# Patient Record
Sex: Female | Born: 1995 | Race: White | Hispanic: No | Marital: Single | Smoking: Former smoker
Health system: Southern US, Community
[De-identification: ages and names within clinical notes are randomized; demographics above are authoritative.]

## PROBLEM LIST (undated history)

## (undated) ENCOUNTER — Inpatient Hospital Stay (HOSPITAL_COMMUNITY): Payer: Self-pay

## (undated) DIAGNOSIS — M549 Dorsalgia, unspecified: Secondary | ICD-10-CM

## (undated) DIAGNOSIS — T8859XA Other complications of anesthesia, initial encounter: Secondary | ICD-10-CM

## (undated) DIAGNOSIS — T4145XA Adverse effect of unspecified anesthetic, initial encounter: Secondary | ICD-10-CM

## (undated) DIAGNOSIS — J45909 Unspecified asthma, uncomplicated: Secondary | ICD-10-CM

## (undated) HISTORY — DX: Other complications of anesthesia, initial encounter: T88.59XA

## (undated) HISTORY — PX: ANKLE SURGERY: SHX546

## (undated) HISTORY — PX: WISDOM TOOTH EXTRACTION: SHX21

## (undated) HISTORY — DX: Adverse effect of unspecified anesthetic, initial encounter: T41.45XA

---

## 2017-05-28 ENCOUNTER — Emergency Department (HOSPITAL_COMMUNITY): Payer: Self-pay

## 2017-05-28 ENCOUNTER — Encounter (HOSPITAL_COMMUNITY): Payer: Self-pay | Admitting: Emergency Medicine

## 2017-05-28 ENCOUNTER — Emergency Department (HOSPITAL_COMMUNITY)
Admission: EM | Admit: 2017-05-28 | Discharge: 2017-05-28 | Disposition: A | Discharge status: Home / Self Care | Payer: Self-pay | Attending: Emergency Medicine | Admitting: Emergency Medicine

## 2017-05-28 DIAGNOSIS — G8929 Other chronic pain: Secondary | ICD-10-CM | POA: Insufficient documentation

## 2017-05-28 DIAGNOSIS — M545 Low back pain, unspecified: Secondary | ICD-10-CM

## 2017-05-28 HISTORY — DX: Dorsalgia, unspecified: M54.9

## 2017-05-28 LAB — URINALYSIS, ROUTINE W REFLEX MICROSCOPIC
Bilirubin Urine: NEGATIVE
GLUCOSE, UA: NEGATIVE mg/dL
Hgb urine dipstick: NEGATIVE
Ketones, ur: NEGATIVE mg/dL
Leukocytes, UA: NEGATIVE
Nitrite: NEGATIVE
Protein, ur: NEGATIVE mg/dL
SPECIFIC GRAVITY, URINE: 1.025 (ref 1.005–1.030)
pH: 6 (ref 5.0–8.0)

## 2017-05-28 LAB — PREGNANCY, URINE: PREG TEST UR: NEGATIVE

## 2017-05-28 MED ORDER — MELOXICAM 15 MG PO TABS: 15.0000 mg | ORAL_TABLET | Freq: Every day | ORAL | 0 refills | Status: DC

## 2017-05-28 MED ORDER — KETOROLAC TROMETHAMINE 60 MG/2ML IM SOLN
60.0000 mg | Freq: Once | INTRAMUSCULAR | Status: AC
  Administered 2017-05-28: 60 mg via INTRAMUSCULAR
  Filled 2017-05-28: qty 2

## 2017-05-28 MED ORDER — DEXAMETHASONE SODIUM PHOSPHATE 10 MG/ML IJ SOLN
10.0000 mg | Freq: Once | INTRAMUSCULAR | Status: AC
  Administered 2017-05-28: 10 mg via INTRAMUSCULAR
  Filled 2017-05-28: qty 1

## 2017-05-28 MED ORDER — ACETAMINOPHEN 325 MG PO TABS
650.0000 mg | ORAL_TABLET | Freq: Once | ORAL | Status: AC
  Administered 2017-05-28: 650 mg via ORAL
  Filled 2017-05-28: qty 2

## 2017-05-28 MED ORDER — IBUPROFEN 800 MG PO TABS
800.0000 mg | ORAL_TABLET | Freq: Once | ORAL | Status: AC
  Administered 2017-05-28: 800 mg via ORAL
  Filled 2017-05-28: qty 1

## 2017-05-28 NOTE — Discharge Instructions (Signed)
Your xray and work up were negative. You will need to follow up wit a primary care doctor.SEEK IMMEDIATE MEDICAL ATTENTION IF: New numbness, tingling, weakness, or problem with the use of your arms or legs.  Severe back pain not relieved with medications.  Change in bowel or bladder control.  Increasing pain in any areas of the body (such as chest or abdominal pain).  Shortness of breath, dizziness or fainting.  Nausea (feeling sick to your stomach), vomiting, fever, or sweats.

## 2017-05-28 NOTE — ED Provider Notes (Signed)
[facility]-EMERGENCY DEPT Provider Note    By signing my name below, I, Earmon Phoenix, attest that this documentation has been prepared under the direction and in the presence of Arthor Captain, PA-C. Electronically Signed: Earmon Phoenix, ED Scribe. 05/28/17. 1:27 PM.    History   Chief Complaint Chief Complaint  Patient presents with  . Back Pain   The history is provided by the patient and medical records. No language interpreter was used.    Laura Estrada is an obese 21 y.o. female with PMHx of chronic back pain who presents to the Emergency Department complaining of worsening low back pain that began two days ago. She reports associated pain that radiates down the posterior LLE to the knee. She reports dysuria for the past two weeks. She states she has seen a chiropractor in the past with some relief but has not seen one for this exacerbation. She reports first injuring her back about one year ago by lifting a heavy box. She has taken Ibuprofen for pain with no significant relief. Movements increase the pain.There are no alleviating factors noted. She denies bowel or bladder incontinence, saddle anesthesia, nausea, vomiting, fever, chills. Pt is sexually active and is not on any birth control.   Past Medical History:  Diagnosis Date  . Back pain     There are no active problems to display for this patient.   No past surgical history on file.  OB History    No data available       Home Medications    Prior to Admission medications   Not on File    Family History No family history on file.  Social History Social History  Substance Use Topics  . Smoking status: Not on file  . Smokeless tobacco: Not on file  . Alcohol use Not on file     Allergies   Patient has no allergy information on record.   Review of Systems Review of Systems  Constitutional: Negative for chills and fever.  Gastrointestinal: Negative for nausea and vomiting.       No bowel or  bladder incontinence  Genitourinary: Positive for dysuria.  Musculoskeletal: Positive for back pain.  Neurological: Negative for weakness and numbness.     Physical Exam Updated Vital Signs BP (!) 131/91 (BP Location: Left Arm)   Pulse 99   Temp 98 F (36.7 C) (Oral)   Resp 18   Ht 5\' 7"  (1.702 m)   Wt 210 lb (95.3 kg)   LMP 05/08/2017 (Exact Date)   SpO2 95%   BMI 32.89 kg/m   Physical Exam  Constitutional: She is oriented to person, place, and time. She appears well-developed and well-nourished.  HENT:  Head: Normocephalic and atraumatic.  Neck: Normal range of motion.  Cardiovascular: Normal rate.   Pulmonary/Chest: Effort normal.  Musculoskeletal: Normal range of motion.  Neurological: She is alert and oriented to person, place, and time.  Skin: Skin is warm and dry.  Psychiatric: She has a normal mood and affect. Her behavior is normal.  Nursing note and vitals reviewed.    ED Treatments / Results  DIAGNOSTIC STUDIES: Oxygen Saturation is 95% on RA, normal by my interpretation.   COORDINATION OF CARE: 11:50 AM- Will order urinalysis and pain reliever. Pt verbalizes understanding and agrees to plan.  1:30 PM- Will X-Ray lumbar spine. Urinalysis negative.   Medications  dexamethasone (DECADRON) injection 10 mg (not administered)  ketorolac (TORADOL) injection 60 mg (not administered)  ibuprofen (ADVIL,MOTRIN) tablet 800 mg (800  mg Oral Given 05/28/17 1233)  acetaminophen (TYLENOL) tablet 650 mg (650 mg Oral Given 05/28/17 1233)    Labs (all labs ordered are listed, but only abnormal results are displayed) Labs Reviewed  URINALYSIS, ROUTINE W REFLEX MICROSCOPIC  PREGNANCY, URINE    EKG  EKG Interpretation None       Radiology No results found.  Procedures Procedures (including critical care time)  Medications Ordered in ED Medications  dexamethasone (DECADRON) injection 10 mg (not administered)  ketorolac (TORADOL) injection 60 mg (not  administered)  ibuprofen (ADVIL,MOTRIN) tablet 800 mg (800 mg Oral Given 05/28/17 1233)  acetaminophen (TYLENOL) tablet 650 mg (650 mg Oral Given 05/28/17 1233)     Initial Impression / Assessment and Plan / ED Course  I have reviewed the triage vital signs and the nursing notes.  Pertinent labs & imaging results that were available during my care of the patient were reviewed by me and considered in my medical decision making (see chart for details).     Patient with exacerbation of chronic low back pain. No neurological deficits and normal neuro exam.  Patient is ambulatory. No loss of bowel or bladder control. No concern for cauda equina. No fever, night sweats, weight loss, h/o cancer, IVDA, no recent procedure to back. Urinalysis obtained due to dysuria for the past two week is negative. X-Ray negative for obvious fracture or dislocation. Supportive care and return precaution discussed. Appears safe for discharge at this time. Follow up as indicated in discharge paperwork.    Final Clinical Impressions(s) / ED Diagnoses   Final diagnoses:  Chronic low back pain without sciatica, unspecified back pain laterality    New Prescriptions New Prescriptions   No medications on file    I personally performed the services described in this documentation, which was scribed in my presence. The recorded information has been reviewed and is accurate.        Arthor CaptainHarris, Fernande Treiber, PA-C 05/28/17 1634    Cathren LaineSteinl, Kevin, MD 06/01/17 [phone]

## 2017-05-28 NOTE — ED Notes (Signed)
Patient transported to X-ray 

## 2017-05-28 NOTE — ED Triage Notes (Signed)
Pt reports that she chronic back pain from injury lifting last year. Has seen chiropractors on and off and sometimes that helps. Lately been taking 800mg  ibuprofen and not helping. States [place] doc in [place] that gave her injection and helped for 6mos. Neuro intact. Ambulates [facility]

## 2017-12-17 ENCOUNTER — Encounter (HOSPITAL_COMMUNITY): Payer: Self-pay

## 2017-12-17 ENCOUNTER — Other Ambulatory Visit: Payer: Self-pay

## 2017-12-17 ENCOUNTER — Emergency Department (HOSPITAL_COMMUNITY)
Admission: EM | Admit: 2017-12-17 | Discharge: 2017-12-17 | Disposition: A | Discharge status: Home / Self Care | Payer: Medicaid Other | Attending: Emergency Medicine | Admitting: Emergency Medicine

## 2017-12-17 ENCOUNTER — Emergency Department (HOSPITAL_COMMUNITY): Payer: Medicaid Other

## 2017-12-17 DIAGNOSIS — R102 Pelvic and perineal pain: Secondary | ICD-10-CM | POA: Diagnosis not present

## 2017-12-17 DIAGNOSIS — N760 Acute vaginitis: Secondary | ICD-10-CM

## 2017-12-17 DIAGNOSIS — R109 Unspecified abdominal pain: Secondary | ICD-10-CM

## 2017-12-17 DIAGNOSIS — Z3A00 Weeks of gestation of pregnancy not specified: Secondary | ICD-10-CM | POA: Insufficient documentation

## 2017-12-17 DIAGNOSIS — O23599 Infection of other part of genital tract in pregnancy, unspecified trimester: Secondary | ICD-10-CM | POA: Insufficient documentation

## 2017-12-17 DIAGNOSIS — B9689 Other specified bacterial agents as the cause of diseases classified elsewhere: Secondary | ICD-10-CM

## 2017-12-17 DIAGNOSIS — O26899 Other specified pregnancy related conditions, unspecified trimester: Secondary | ICD-10-CM

## 2017-12-17 DIAGNOSIS — O9989 Other specified diseases and conditions complicating pregnancy, childbirth and the puerperium: Secondary | ICD-10-CM | POA: Insufficient documentation

## 2017-12-17 LAB — WET PREP, GENITAL
Sperm: NONE SEEN
Trich, Wet Prep: NONE SEEN
Yeast Wet Prep HPF POC: NONE SEEN

## 2017-12-17 LAB — BASIC METABOLIC PANEL
Anion gap: 10 (ref 5–15)
BUN: 10 mg/dL (ref 6–20)
CALCIUM: 9.7 mg/dL (ref 8.9–10.3)
CO2: 19 mmol/L — ABNORMAL LOW (ref 22–32)
CREATININE: 0.81 mg/dL (ref 0.44–1.00)
Chloride: 107 mmol/L (ref 101–111)
GFR calc non Af Amer: >60 mL/min (ref >60)
Glucose, Bld: 114 mg/dL — ABNORMAL HIGH (ref 65–99)
Potassium: 3.8 mmol/L (ref 3.5–5.1)
SODIUM: 136 mmol/L (ref 135–145)

## 2017-12-17 LAB — CBC
HCT: 39.1 % (ref 36.0–46.0)
Hemoglobin: 12.7 g/dL (ref 12.0–15.0)
MCH: 25.8 pg — ABNORMAL LOW (ref 26.0–34.0)
MCHC: 32.5 g/dL (ref 30.0–36.0)
MCV: 79.3 fL (ref 78.0–100.0)
PLATELETS: 340 10*3/uL (ref 150–400)
RBC: 4.93 MIL/uL (ref 3.87–5.11)
RDW: 14.9 % (ref 11.5–15.5)
WBC: 12.5 10*3/uL — AB (ref 4.0–10.5)

## 2017-12-17 LAB — URINALYSIS, ROUTINE W REFLEX MICROSCOPIC
Bilirubin Urine: NEGATIVE
Glucose, UA: NEGATIVE mg/dL
Hgb urine dipstick: NEGATIVE
KETONES UR: 5 mg/dL — AB
LEUKOCYTES UA: NEGATIVE
NITRITE: NEGATIVE
PROTEIN: NEGATIVE mg/dL
Specific Gravity, Urine: 1.024 (ref 1.005–1.030)
pH: 5 (ref 5.0–8.0)

## 2017-12-17 LAB — I-STAT BETA HCG BLOOD, ED (MC, WL, AP ONLY): HCG, QUANTITATIVE: 182.9 m[IU]/mL — AB (ref <5)

## 2017-12-17 MED ORDER — METRONIDAZOLE 0.75 % VA GEL: 1.0000 | Freq: Every day | VAGINAL | 0 refills | Status: AC

## 2017-12-17 MED ORDER — PRENATAL COMPLETE 14-0.4 MG PO TABS: 1.0000 | ORAL_TABLET | Freq: Every day | ORAL | 0 refills | Status: DC

## 2017-12-17 NOTE — ED Notes (Signed)
Phlebotomy at bedside.

## 2017-12-17 NOTE — ED Notes (Signed)
Patient transported to Ultrasound 

## 2017-12-17 NOTE — ED Triage Notes (Signed)
Per Pt, Pt is coming from home with left lower pelvic pain and back pain that started yesterday. Pt denies any bleeding at this time, but reports having a positive pregnancy test. Hx of miscarriages.

## 2017-12-17 NOTE — ED Notes (Signed)
Per main lab, urine specimen never received. EDP aware. This RN to collect another sample. Pt updated to delay

## 2017-12-17 NOTE — ED Provider Notes (Signed)
[facility] EMERGENCY DEPARTMENT Provider Note   CSN: [id number] Arrival date & time: 12/17/17  0820     History   Chief Complaint Chief Complaint  Patient presents with  . Abdominal Cramping    HPI Laura Estrada is a 22 y.o. G2P0 female who is previously healthy who presents with a 2-3-day history of intermittent left lower pelvic cramping.  Patient reports she took a pregnancy test 2 days ago which was positive.  Her LMP finished on 11/11/2017.  She reports a white vaginal discharge for the past few days.  She has low concern for STD exposure.  She denies any abnormal vaginal bleeding.  She is not taking any medications at home for symptoms.  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  She denies any fevers.  She has had some associated nausea, vomiting, and one episode of nonbloody diarrhea.  She denies any chest pain or shortness of breath.  HPI  Past Medical History:  Diagnosis Date  . Back pain     There are no active problems to display for this patient.   Past Surgical History:  Procedure Laterality Date  . ANKLE SURGERY      OB History    Gravida Para Term Preterm AB Living   1             SAB TAB Ectopic Multiple Live Births                   Home Medications    Prior to Admission medications   Medication Sig Start Date End Date Taking? Authorizing Provider  meloxicam (MOBIC) 15 MG tablet Take 1 tablet (15 mg total) by mouth daily. Take 1 daily with food. Patient not taking: Reported on 12/17/2017 05/28/17   Arthor Captain, PA-C  metroNIDAZOLE (METROGEL VAGINAL) 0.75 % vaginal gel Place 1 Applicatorful vaginally at bedtime for 5 days. 12/17/17 12/22/17  Emi Holes, PA-C  Prenatal Vit-Fe Fumarate-FA (PRENATAL COMPLETE) 14-0.4 MG TABS Take 1 tablet by mouth daily. 12/17/17   Emi Holes, PA-C    Family History No family history on file.  Social History Social History   Tobacco Use  . Smoking  status: Never Smoker  . Smokeless tobacco: Never Used  Substance Use Topics  . Alcohol use: No    Frequency: Never  . Drug use: Yes    Types: Marijuana     Allergies   Penicillins   Review of Systems Review of Systems  Constitutional: Negative for chills and fever.  HENT: Negative for facial swelling and sore throat.   Respiratory: Negative for shortness of breath.   Cardiovascular: Negative for chest pain.  Gastrointestinal: Positive for diarrhea, nausea and vomiting. Negative for abdominal pain.  Genitourinary: Positive for pelvic pain and vaginal discharge. Negative for dysuria and vaginal bleeding.  Musculoskeletal: Negative for back pain.  Skin: Negative for rash and wound.  Neurological: Negative for headaches.  Psychiatric/Behavioral: The patient is not nervous/anxious.      Physical Exam Updated Vital Signs BP 130/72   Pulse 95   Temp 98.1 F (36.7 C) (Oral)   Resp 14   Ht 5\' 7"  (1.702 m)   Wt 95.3 kg (210 lb)   LMP 11/11/2017   SpO2 98%   BMI 32.89 kg/m   Physical Exam  Constitutional: She appears well-developed and well-nourished. No distress.  HENT:  Head: Normocephalic and atraumatic.  Mouth/Throat: Oropharynx is clear and moist. No oropharyngeal exudate.  Eyes:  Conjunctivae are normal. Pupils are equal, round, and reactive to light. Right eye exhibits no discharge. Left eye exhibits no discharge. No scleral icterus.  Neck: Normal range of motion. Neck supple. No thyromegaly present.  Cardiovascular: Normal rate, regular rhythm, normal heart sounds and intact distal pulses. Exam reveals no gallop and no friction rub.  No murmur heard. Pulmonary/Chest: Effort normal and breath sounds normal. No stridor. No respiratory distress. She has no wheezes. She has no rales.  Abdominal: Soft. Bowel sounds are normal. She exhibits no distension. There is no tenderness. There is no rebound and no guarding.  Genitourinary: Uterus normal. Cervix exhibits no motion  tenderness. Right adnexum displays no tenderness. Left adnexum displays no tenderness. Vaginal discharge (white) found.  Musculoskeletal: She exhibits no edema.  Lymphadenopathy:    She has no cervical adenopathy.  Neurological: She is alert. Coordination normal.  Skin: Skin is warm and dry. No rash noted. She is not diaphoretic. No pallor.  Psychiatric: She has a normal mood and affect.  Nursing note and vitals reviewed.    ED Treatments / Results  Labs (all labs ordered are listed, but only abnormal results are displayed) Labs Reviewed  WET PREP, GENITAL - Abnormal; Notable for the following components:      Result Value   Clue Cells Wet Prep HPF POC PRESENT (*)    WBC, Wet Prep HPF POC MANY (*)    All other components within normal limits  URINALYSIS, ROUTINE W REFLEX MICROSCOPIC - Abnormal; Notable for the following components:   Ketones, ur 5 (*)    All other components within normal limits  BASIC METABOLIC PANEL - Abnormal; Notable for the following components:   CO2 19 (*)    Glucose, Bld 114 (*)    All other components within normal limits  CBC - Abnormal; Notable for the following components:   WBC 12.5 (*)    MCH 25.8 (*)    All other components within normal limits  I-STAT BETA HCG BLOOD, ED ([facility], [facility], [facility] ONLY) - Abnormal; Notable for the following components:   I-stat hCG, quantitative 182.9 (*)    All other components within normal limits  RPR  HIV ANTIBODY (ROUTINE TESTING)  GC/CHLAMYDIA PROBE AMP ([place]) NOT AT [facility]    EKG  EKG Interpretation None       Radiology US Ob Comp < 14 Wks  Result Date: 12/17/2017 CLINICAL DATA:  Left pelvic pain for 5 days. Positive pregnancy test. EXAM: OBSTETRIC <14 WK [place] AND TRANSVAGINAL OB US TECHNIQUE: Both transabdominal and transvaginal ultrasound examinations were performed for complete evaluation of the gestation as well as the maternal uterus, adnexal regions, and pelvic cul-de-sac. Transvaginal technique was  performed to assess early pregnancy. COMPARISON:  None. FINDINGS: Intrauterine gestational sac: None visualized. Subchorionic hemorrhage:  None visualized. Maternal uterus/adnexae: 17 mm complex, thick walled cystic focus identified in the parenchyma of the left ovary with no architecture identified within the small central cystic component. Right ovary unremarkable. No evidence for adnexal mass. Very trace simple appearing free fluid in the cul-de-sac. IMPRESSION: 1. No intrauterine gestational sac. Probable left ovarian corpus luteum cyst with no adnexal mass identified. Given the history of a positive pregnancy test, differential considerations include intrauterine gestation too early to visualize, completed abortion, or nonvisualized ectopic pregnancy. Electronically Signed   By: Kennith Center M.D.   On: 12/17/2017 13:22   US Ob Transvaginal  Result Date: 12/17/2017 CLINICAL DATA:  Left pelvic pain for 5 days. Positive pregnancy  test. EXAM: OBSTETRIC <14 WK US AND TRANSVAGINAL OB US TECHNIQUE: Both transabdominal and transvaginal ultrasound examinations were performed for complete evaluation of the gestation as well as the maternal uterus, adnexal regions, and pelvic cul-de-sac. Transvaginal technique was performed to assess early pregnancy. COMPARISON:  None. FINDINGS: Intrauterine gestational sac: None visualized. Subchorionic hemorrhage:  None visualized. Maternal uterus/adnexae: 17 mm complex, thick walled cystic focus identified in the parenchyma of the left ovary with no architecture identified within the small central cystic component. Right ovary unremarkable. No evidence for adnexal mass. Very trace simple appearing free fluid in the cul-de-sac. IMPRESSION: 1. No intrauterine gestational sac. Probable left ovarian corpus luteum cyst with no adnexal mass identified. Given the history of a positive pregnancy test, differential considerations include intrauterine gestation too early to visualize,  completed abortion, or nonvisualized ectopic pregnancy. Electronically Signed   By: Kennith CenterEric  Mansell M.D.   On: 12/17/2017 13:22    Procedures Procedures (including critical care time)  Medications Ordered in ED Medications - No data to display   Initial Impression / Assessment and Plan / ED Course  I have reviewed the triage vital signs and the nursing notes.  Pertinent labs & imaging results that were available during my care of the patient were reviewed by me and considered in my medical decision making (see chart for details).     Patient with left pelvic cramping over the past few days.  Patient had a positive pregnancy test 2 days ago.  HCG today is 182.9.  Pelvic ultrasound shows left ovarian cyst, 17 mm, without adnexal mass, but no intrauterine gestational sac.  Labs unremarkable except for mild elevation of white blood cell count, 12.5.  GC/chlamydia, HIV, RPR pending.  Will treat BV found on wet prep with MetroGel.  [facility] in 2 days for serial hCGs and further evaluation and treatment.  Initiated prenatal vitamins.  Return precautions discussed.  Patient understands and agrees with plan.  Patient vitals stable throughout ED course and discharged in satisfactory condition.  Final Clinical Impressions(s) / ED Diagnoses   Final diagnoses:  Abdominal pain in pregnancy, antepartum  BV (bacterial vaginosis)    ED Discharge Orders        Ordered    Prenatal Vit-Fe Fumarate-FA (PRENATAL COMPLETE) 14-0.4 MG TABS  Daily     12/17/17 1411    metroNIDAZOLE (METROGEL VAGINAL) 0.75 % vaginal gel  Daily at bedtime     12/17/17 [id number]       Azure Budnick M, PA-C 12/17/17 1645    Tegeler, Canary Brimhristopher J, MD 12/17/17 [phone]

## 2017-12-17 NOTE — Discharge Instructions (Signed)
Use MetroGel as prescribed for 5 days for bacterial vaginosis.  Begin taking prenatal vitamins.  Please follow-up at the women's outpatient clinic in 48 hours for repeat hormone levels.  [facility] or return to the emergency department immediately if you develop any new or worsening symptoms.

## 2017-12-18 LAB — GC/CHLAMYDIA PROBE AMP (~~LOC~~) NOT AT ARMC
Chlamydia: POSITIVE — AB
Neisseria Gonorrhea: NEGATIVE

## 2017-12-18 LAB — RPR: RPR Ser Ql: NONREACTIVE

## 2017-12-19 LAB — HIV ANTIBODY (ROUTINE TESTING W REFLEX): HIV Screen 4th Generation wRfx: NONREACTIVE

## 2017-12-22 ENCOUNTER — Ambulatory Visit (INDEPENDENT_AMBULATORY_CARE_PROVIDER_SITE_OTHER): Payer: Self-pay | Admitting: General Practice

## 2017-12-22 ENCOUNTER — Encounter: Payer: Self-pay | Admitting: Family Medicine

## 2017-12-22 DIAGNOSIS — B9689 Other specified bacterial agents as the cause of diseases classified elsewhere: Secondary | ICD-10-CM

## 2017-12-22 DIAGNOSIS — N760 Acute vaginitis: Secondary | ICD-10-CM

## 2017-12-22 DIAGNOSIS — O3680X0 Pregnancy with inconclusive fetal viability, not applicable or unspecified: Secondary | ICD-10-CM

## 2017-12-22 DIAGNOSIS — Z3490 Encounter for supervision of normal pregnancy, unspecified, unspecified trimester: Secondary | ICD-10-CM

## 2017-12-22 LAB — HCG, QUANTITATIVE, PREGNANCY: HCG, BETA CHAIN, QUANT, S: 2510 m[IU]/mL — AB (ref <5)

## 2017-12-22 MED ORDER — METRONIDAZOLE 500 MG PO TABS: 500.0000 mg | ORAL_TABLET | Freq: Two times a day (BID) | ORAL | 0 refills | Status: DC

## 2017-12-22 NOTE — Progress Notes (Signed)
Patient here for stat bhcg following ER visit on 1/10. Patient reports improved pain, still has some pain on left side. Discussed with patient waiting in lobby for results & updated plan of care. Patient verbalized understanding and has no questions at this time  Reviewed results with Dr Erin FullingHarraway Smith who recommends follow up ultrasound in 2 weeks and new OB appt. Scheduled u/s 1/29 @ 8am.  Informed patient of results & u/s appt. Ectopic precautions given. Patient verbalized understanding to all & had no questions   Attestation of Attending Supervision of RN: Evaluation and management procedures were performed by the nurse under my supervision and collaboration.  I have reviewed the nursing note and chart, and I agree with the management and plan.  Carolyn L. Harraway-Smith, M.D., Evern CoreFACOG

## 2017-12-24 ENCOUNTER — Encounter: Payer: Self-pay | Admitting: Obstetrics & Gynecology

## 2017-12-24 DIAGNOSIS — A749 Chlamydial infection, unspecified: Secondary | ICD-10-CM | POA: Insufficient documentation

## 2017-12-24 DIAGNOSIS — O98819 Other maternal infectious and parasitic diseases complicating pregnancy, unspecified trimester: Secondary | ICD-10-CM

## 2018-01-01 ENCOUNTER — Inpatient Hospital Stay (HOSPITAL_COMMUNITY)
Admission: AD | Admit: 2018-01-01 | Discharge: 2018-01-01 | Disposition: A | Discharge status: Home / Self Care | Payer: Medicaid Other | Source: Ambulatory Visit | Attending: Obstetrics and Gynecology | Admitting: Obstetrics and Gynecology

## 2018-01-01 ENCOUNTER — Other Ambulatory Visit: Payer: Self-pay

## 2018-01-01 ENCOUNTER — Encounter (HOSPITAL_COMMUNITY): Payer: Self-pay

## 2018-01-01 DIAGNOSIS — O99611 Diseases of the digestive system complicating pregnancy, first trimester: Secondary | ICD-10-CM | POA: Insufficient documentation

## 2018-01-01 DIAGNOSIS — A084 Viral intestinal infection, unspecified: Secondary | ICD-10-CM | POA: Insufficient documentation

## 2018-01-01 DIAGNOSIS — Z3A01 Less than 8 weeks gestation of pregnancy: Secondary | ICD-10-CM | POA: Insufficient documentation

## 2018-01-01 DIAGNOSIS — J069 Acute upper respiratory infection, unspecified: Secondary | ICD-10-CM | POA: Diagnosis not present

## 2018-01-01 DIAGNOSIS — O99511 Diseases of the respiratory system complicating pregnancy, first trimester: Secondary | ICD-10-CM | POA: Diagnosis not present

## 2018-01-01 DIAGNOSIS — R0981 Nasal congestion: Secondary | ICD-10-CM | POA: Diagnosis present

## 2018-01-01 DIAGNOSIS — O219 Vomiting of pregnancy, unspecified: Secondary | ICD-10-CM

## 2018-01-01 DIAGNOSIS — Z880 Allergy status to penicillin: Secondary | ICD-10-CM | POA: Insufficient documentation

## 2018-01-01 LAB — INFLUENZA PANEL BY PCR (TYPE A & B)
INFLAPCR: NEGATIVE
INFLBPCR: NEGATIVE

## 2018-01-01 LAB — RAPID STREP SCREEN (MED CTR MEBANE ONLY): STREPTOCOCCUS, GROUP A SCREEN (DIRECT): NEGATIVE

## 2018-01-01 LAB — URINALYSIS, ROUTINE W REFLEX MICROSCOPIC
BILIRUBIN URINE: NEGATIVE
Bacteria, UA: NONE SEEN
Glucose, UA: NEGATIVE mg/dL
Hgb urine dipstick: NEGATIVE
Ketones, ur: NEGATIVE mg/dL
Nitrite: NEGATIVE
PH: 7 (ref 5.0–8.0)
Protein, ur: NEGATIVE mg/dL
SPECIFIC GRAVITY, URINE: 1.019 (ref 1.005–1.030)

## 2018-01-01 LAB — COMPREHENSIVE METABOLIC PANEL
ALK PHOS: 60 U/L (ref 38–126)
ALT: 23 U/L (ref 14–54)
AST: 23 U/L (ref 15–41)
Albumin: 3.9 g/dL (ref 3.5–5.0)
Anion gap: 12 (ref 5–15)
BILIRUBIN TOTAL: 0.7 mg/dL (ref 0.3–1.2)
BUN: 13 mg/dL (ref 6–20)
CO2: 19 mmol/L — ABNORMAL LOW (ref 22–32)
CREATININE: 0.61 mg/dL (ref 0.44–1.00)
Calcium: 9.5 mg/dL (ref 8.9–10.3)
Chloride: 100 mmol/L — ABNORMAL LOW (ref 101–111)
Glucose, Bld: 100 mg/dL — ABNORMAL HIGH (ref 65–99)
Potassium: 4 mmol/L (ref 3.5–5.1)
Sodium: 131 mmol/L — ABNORMAL LOW (ref 135–145)
TOTAL PROTEIN: 7.7 g/dL (ref 6.5–8.1)

## 2018-01-01 LAB — CBC
HEMATOCRIT: 37.8 % (ref 36.0–46.0)
HEMOGLOBIN: 12.7 g/dL (ref 12.0–15.0)
MCH: 26.5 pg (ref 26.0–34.0)
MCHC: 33.6 g/dL (ref 30.0–36.0)
MCV: 78.8 fL (ref 78.0–100.0)
PLATELETS: 321 10*3/uL (ref 150–400)
RBC: 4.8 MIL/uL (ref 3.87–5.11)
RDW: 15.8 % — ABNORMAL HIGH (ref 11.5–15.5)
WBC: 13.7 10*3/uL — ABNORMAL HIGH (ref 4.0–10.5)

## 2018-01-01 MED ORDER — LACTATED RINGERS IV BOLUS (SEPSIS)
1000.0000 mL | Freq: Once | INTRAVENOUS | Status: AC
  Administered 2018-01-01: 1000 mL via INTRAVENOUS

## 2018-01-01 MED ORDER — PROMETHAZINE HCL 25 MG/ML IJ SOLN
25.0000 mg | Freq: Once | INTRAMUSCULAR | Status: AC
  Administered 2018-01-01: 25 mg via INTRAVENOUS
  Filled 2018-01-01: qty 1

## 2018-01-01 MED ORDER — PROMETHAZINE HCL 25 MG PO TABS: 12.5000 mg | ORAL_TABLET | Freq: Four times a day (QID) | ORAL | 2 refills | Status: AC | PRN

## 2018-01-01 NOTE — MAU Note (Signed)
Can't keep anything down. meds aren't working.  Has cough and congestion, throat doesn't really hurt,  Has not checked temperature.  Co-workers have been sick

## 2018-01-01 NOTE — MAU Provider Note (Signed)
Chief Complaint: Emesis; Cough; and Nasal Congestion   First Provider Initiated Contact with Patient 01/01/18 1213      SUBJECTIVE HPI: Laura Estrada is a 22 y.o. G1P0 at 3564w2d by LMP who presents to maternity admissions reporting congestion, cough, sore throat, n/v and diarrhea x 4 days. She reports the first symptoms were nasal congestion and sore throat, followed by cough, then n/v/d.  She reports vomiting 1-2 times daily and diarrhea x2 daily for 4 days. She has known exposure to an illness at work with co-workers having URI and GI symptoms.  She has not tried any treatments. Although she reports intermittent chills and feeling feverish, she has not taken her temperature at home.  There are no other associated symptoms. She had US on 1/10 that did not show IUP but had appropriate rise in hcg from 1/10 to 1/15 and has outpatient US ordered on 1/29.  She denies abdominal pain, vaginal bleeding, vaginal itching/burning, urinary symptoms, h/a, or dizziness.   HPI  Past Medical History:  Diagnosis Date  . Back pain    Past Surgical History:  Procedure Laterality Date  . ANKLE SURGERY     Social History   Socioeconomic History  . Marital status: Single    Spouse name: Not on file  . Number of children: Not on file  . Years of education: Not on file  . Highest education level: Not on file  Social Needs  . Financial resource strain: Not on file  . Food insecurity - worry: Not on file  . Food insecurity - inability: Not on file  . Transportation needs - medical: Not on file  . Transportation needs - non-medical: Not on file  Occupational History  . Not on file  Tobacco Use  . Smoking status: Never Smoker  . Smokeless tobacco: Never Used  Substance and Sexual Activity  . Alcohol use: No    Frequency: Never  . Drug use: Yes    Types: Marijuana  . Sexual activity: Not on file  Other Topics Concern  . Not on file  Social History Narrative  . Not on file   No current  facility-administered medications on file prior to encounter.    Current Outpatient Medications on File Prior to Encounter  Medication Sig Dispense Refill  . meloxicam (MOBIC) 15 MG tablet Take 1 tablet (15 mg total) by mouth daily. Take 1 daily with food. (Patient not taking: Reported on 12/17/2017) 10 tablet 0  . metroNIDAZOLE (FLAGYL) 500 MG tablet Take 1 tablet (500 mg total) by mouth 2 (two) times daily. 14 tablet 0  . Prenatal Vit-Fe Fumarate-FA (PRENATAL COMPLETE) 14-0.4 MG TABS Take 1 tablet by mouth daily. 60 each 0   Allergies  Allergen Reactions  . Penicillins Rash    Has patient had a PCN reaction causing immediate rash, facial/tongue/throat swelling, SOB or lightheadedness with hypotension: YES Has patient had a PCN reaction causing severe rash involving mucus membranes or skin necrosis: NO Has patient had a PCN reaction that required hospitalization: UNK Has patient had a PCN reaction occurring within the last 10 years: NO If all of the above answers are "NO", then may proceed with Cephalosporin use.    ROS:  Review of Systems  Constitutional: Positive for chills. Negative for fatigue and fever.  HENT: Positive for congestion, rhinorrhea and sore throat. Negative for sinus pressure.   Eyes: Negative for photophobia.  Respiratory: Negative for shortness of breath.   Cardiovascular: Negative for chest pain.  Gastrointestinal: Positive for  diarrhea, nausea and vomiting. Negative for abdominal pain and constipation.  Genitourinary: Negative for difficulty urinating, dysuria, flank pain, frequency, pelvic pain, vaginal bleeding, vaginal discharge and vaginal pain.  Musculoskeletal: Negative for neck pain.  Neurological: Negative for dizziness, weakness and headaches.  Psychiatric/Behavioral: Negative.      I have reviewed patient's Past Medical Hx, Surgical Hx, Family Hx, Social Hx, medications and allergies.   Physical Exam   Patient Vitals for the past 24 hrs:  BP Temp  Pulse Resp Weight  01/01/18 1145 120/69 97.7 F (36.5 C) 98 16 -  01/01/18 1125 - - - - 267 lb (121.1 kg)   Constitutional: Well-developed, well-nourished female in no acute distress.  Cardiovascular: normal rate Respiratory: normal effort GI: Abd soft, non-tender. Pos BS x 4 MS: Extremities nontender, no edema, normal ROM Neurologic: Alert and oriented x 4.  GU: Neg CVAT.  PELVIC EXAM: Deferred   LAB RESULTS Results for orders placed or performed during the hospital encounter of 01/01/18 (from the past 24 hour(s))  Urinalysis, Routine w reflex microscopic     Status: Abnormal   Collection Time: 01/01/18 11:20 AM  Result Value Ref Range   Color, Urine YELLOW YELLOW   APPearance HAZY (A) CLEAR   Specific Gravity, Urine 1.019 1.005 - 1.030   pH 7.0 5.0 - 8.0   Glucose, UA NEGATIVE NEGATIVE mg/dL   Hgb urine dipstick NEGATIVE NEGATIVE   Bilirubin Urine NEGATIVE NEGATIVE   Ketones, ur NEGATIVE NEGATIVE mg/dL   Protein, ur NEGATIVE NEGATIVE mg/dL   Nitrite NEGATIVE NEGATIVE   Leukocytes, UA TRACE (A) NEGATIVE   RBC / HPF 0-5 0 - 5 RBC/hpf   WBC, UA 0-5 0 - 5 WBC/hpf   Bacteria, UA NONE SEEN NONE SEEN   Squamous Epithelial / LPF 6-30 (A) NONE SEEN   Mucus PRESENT   CBC     Status: Abnormal   Collection Time: 01/01/18 11:56 AM  Result Value Ref Range   WBC 13.7 (H) 4.0 - 10.5 K/uL   RBC 4.80 3.87 - 5.11 MIL/uL   Hemoglobin 12.7 12.0 - 15.0 g/dL   HCT 40.9 81.1 - 91.4 %   MCV 78.8 78.0 - 100.0 fL   MCH 26.5 26.0 - 34.0 pg   MCHC 33.6 30.0 - 36.0 g/dL   RDW 78.2 (H) 95.6 - 21.3 %   Platelets 321 150 - 400 K/uL  Comprehensive metabolic panel     Status: Abnormal   Collection Time: 01/01/18 11:56 AM  Result Value Ref Range   Sodium 131 (L) 135 - 145 mmol/L   Potassium 4.0 3.5 - 5.1 mmol/L   Chloride 100 (L) 101 - 111 mmol/L   CO2 19 (L) 22 - 32 mmol/L   Glucose, Bld 100 (H) 65 - 99 mg/dL   BUN 13 6 - 20 mg/dL   Creatinine, Ser 0.86 0.44 - 1.00 mg/dL   Calcium 9.5 8.9 -  57.8 mg/dL   Total Protein 7.7 6.5 - 8.1 g/dL   Albumin 3.9 3.5 - 5.0 g/dL   AST 23 15 - 41 U/L   ALT 23 14 - 54 U/L   Alkaline Phosphatase 60 38 - 126 U/L   Total Bilirubin 0.7 0.3 - 1.2 mg/dL   GFR calc non Af Amer >60 >60 mL/min   GFR calc Af Amer >60 >60 mL/min   Anion gap 12 5 - 15  Influenza panel by PCR (type A & B)     Status: None   Collection Time:  01/01/18 12:45 PM  Result Value Ref Range   Influenza A By PCR NEGATIVE NEGATIVE   Influenza B By PCR NEGATIVE NEGATIVE  Rapid Strep Screen (Not at [facility])     Status: None   Collection Time: 01/01/18 12:45 PM  Result Value Ref Range   Streptococcus, Group A Screen (Direct) NEGATIVE NEGATIVE       IMAGING   MAU Management/MDM: Ordered labs and reviewed results.  WBCs wnl for pregnancy, electrolytes wnl.  Influenza negative, rapid strep test negative.  Pt taking Flagyl for BV so may have contributed to gastroenteritis.  Pt completed course.  Will treat n/v with Phenergan, pt with loose stools vs watery diarrhea so may take OTC imodium if worsens.  OTC meds for cold symptoms.  Safe meds in pregnancy list given.  Increase PO fluids. F/U as scheduled with outpatient [place] and prenatal care.  Pt discharged with strict precautions/reasons to return reviewed.  ASSESSMENT 1. Viral upper respiratory tract infection   2. Viral gastroenteritis   3. Nausea and vomiting during pregnancy prior to [redacted] weeks gestation     PLAN Discharge home Allergies as of 01/01/2018      Reactions   Penicillins Rash   Has patient had a PCN reaction causing immediate rash, facial/tongue/throat swelling, SOB or lightheadedness with hypotension: YES Has patient had a PCN reaction causing severe rash involving mucus membranes or skin necrosis: NO Has patient had a PCN reaction that required hospitalization: UNK Has patient had a PCN reaction occurring within the last 10 years: NO If all of the above answers are "NO", then may proceed with Cephalosporin use.       Medication List    STOP taking these medications   meloxicam 15 MG tablet Commonly known as:  MOBIC   metroNIDAZOLE 500 MG tablet Commonly known as:  FLAGYL     TAKE these medications   PRENATAL COMPLETE 14-0.4 MG Tabs Take 1 tablet by mouth daily.   promethazine 25 MG tablet Commonly known as:  PHENERGAN Take 0.5-1 tablets (12.5-25 mg total) by mouth every 6 (six) hours as needed.      Follow-up Information    [facility].   Specialty:  Obstetrics and Gynecology Why:  As scheduled, return to MAU as needed for emergencies Contact information: [address] [place] [address] [phone]          Sharen Counter Certified Nurse-Midwife 01/01/2018  2:23 PM

## 2018-01-03 LAB — CULTURE, GROUP A STREP (THRC)

## 2018-01-05 ENCOUNTER — Ambulatory Visit (HOSPITAL_COMMUNITY)
Admission: RE | Admit: 2018-01-05 | Discharge: 2018-01-05 | Disposition: A | Discharge status: Home / Self Care | Payer: Medicaid Other | Source: Ambulatory Visit | Attending: Obstetrics & Gynecology | Admitting: Obstetrics & Gynecology

## 2018-01-05 ENCOUNTER — Ambulatory Visit: Payer: Self-pay | Admitting: *Deleted

## 2018-01-05 DIAGNOSIS — Z3A01 Less than 8 weeks gestation of pregnancy: Secondary | ICD-10-CM | POA: Insufficient documentation

## 2018-01-05 DIAGNOSIS — O3680X0 Pregnancy with inconclusive fetal viability, not applicable or unspecified: Secondary | ICD-10-CM

## 2018-01-05 DIAGNOSIS — Z712 Person consulting for explanation of examination or test findings: Secondary | ICD-10-CM

## 2018-01-05 DIAGNOSIS — Z3491 Encounter for supervision of normal pregnancy, unspecified, first trimester: Secondary | ICD-10-CM | POA: Insufficient documentation

## 2018-01-05 NOTE — Progress Notes (Addendum)
US results reviewed by Dr. Erin FullingHarraway-Smith. Pt and FOB informed of results including a likely change in due date to 08/29/18.  Common complaints of early pregnancy discussed.  Pt has New Ob appt scheduled on 02/02/18. Pt voiced understanding of all information and instructions.    Attestation of Attending Supervision of RN: Evaluation and management procedures were performed by the nurse under my supervision and collaboration.  I have reviewed the nursing note and chart, and I agree with the management and plan.  Carolyn L. Harraway-Smith, M.D., Evern CoreFACOG

## 2018-01-07 ENCOUNTER — Inpatient Hospital Stay (HOSPITAL_COMMUNITY)
Admission: AD | Admit: 2018-01-07 | Discharge: 2018-01-08 | Disposition: A | Discharge status: Home / Self Care | Payer: Medicaid Other | Source: Ambulatory Visit | Attending: Obstetrics & Gynecology | Admitting: Obstetrics & Gynecology

## 2018-01-07 ENCOUNTER — Encounter (HOSPITAL_COMMUNITY): Payer: Self-pay | Admitting: *Deleted

## 2018-01-07 DIAGNOSIS — Z3A01 Less than 8 weeks gestation of pregnancy: Secondary | ICD-10-CM | POA: Insufficient documentation

## 2018-01-07 DIAGNOSIS — O219 Vomiting of pregnancy, unspecified: Secondary | ICD-10-CM

## 2018-01-07 DIAGNOSIS — O210 Mild hyperemesis gravidarum: Secondary | ICD-10-CM | POA: Insufficient documentation

## 2018-01-07 DIAGNOSIS — Z880 Allergy status to penicillin: Secondary | ICD-10-CM | POA: Insufficient documentation

## 2018-01-07 DIAGNOSIS — R112 Nausea with vomiting, unspecified: Secondary | ICD-10-CM

## 2018-01-07 LAB — URINALYSIS, ROUTINE W REFLEX MICROSCOPIC
BILIRUBIN URINE: NEGATIVE
Bacteria, UA: NONE SEEN
GLUCOSE, UA: NEGATIVE mg/dL
HGB URINE DIPSTICK: NEGATIVE
KETONES UR: 20 mg/dL — AB
LEUKOCYTES UA: NEGATIVE
Nitrite: NEGATIVE
PH: 7 (ref 5.0–8.0)
PROTEIN: 30 mg/dL — AB
Specific Gravity, Urine: 1.031 — ABNORMAL HIGH (ref 1.005–1.030)

## 2018-01-07 LAB — POCT PREGNANCY, URINE: Preg Test, Ur: POSITIVE — AB

## 2018-01-07 MED ORDER — PROMETHAZINE HCL 25 MG PO TABS
25.0000 mg | ORAL_TABLET | Freq: Once | ORAL | Status: AC
  Administered 2018-01-07: 25 mg via ORAL
  Filled 2018-01-07: qty 1

## 2018-01-07 MED ORDER — DEXTROSE IN LACTATED RINGERS 5 % IV SOLN
INTRAVENOUS | Status: DC
  Administered 2018-01-07: 23:00:00 via INTRAVENOUS

## 2018-01-07 MED ORDER — LACTATED RINGERS IV SOLN
INTRAVENOUS | Status: DC
  Administered 2018-01-08: via INTRAVENOUS

## 2018-01-07 MED ORDER — PROMETHAZINE HCL 25 MG/ML IJ SOLN
12.5000 mg | Freq: Once | INTRAMUSCULAR | Status: AC
  Administered 2018-01-07: 12.5 mg via INTRAVENOUS
  Filled 2018-01-07: qty 1

## 2018-01-07 NOTE — MAU Note (Signed)
PT SAYS SHE WAS HERE ON 1-25- RECEIVED MEDS FOR VOMITING - PHENERGAN- DID NOT  PICK UP RX.

## 2018-01-07 NOTE — MAU Provider Note (Signed)
Chief Complaint: No chief complaint on file.   First Provider Initiated Contact with Patient 01/07/18 2136        SUBJECTIVE HPI: Laura Estrada is a 22 y.o. G1P0 at 6648w4d by LMP who presents to maternity admissions reporting recurrent vomiting.  Never filled Rx for Phenergan due to cost.  [facility].  States unable to keep anything down. . She denies vaginal bleeding, vaginal itching/burning, urinary symptoms, h/a, dizziness, or fever/chills.    Emesis   This is a recurrent problem. The problem occurs 5 to 10 times per day. The problem has been unchanged. There has been no fever. Associated symptoms include dizziness. Pertinent negatives include no abdominal pain, chills, coughing, diarrhea or fever. She has tried nothing for the symptoms.    RN note: PT SAYS SHE WAS HERE ON 1-25- RECEIVED MEDS FOR VOMITING - PHENERGAN- DID NOT  PICK UP RX   Past Medical History:  Diagnosis Date  . Back pain    Past Surgical History:  Procedure Laterality Date  . ANKLE SURGERY     Social History   Socioeconomic History  . Marital status: Single    Spouse name: Not on file  . Number of children: Not on file  . Years of education: Not on file  . Highest education level: Not on file  Social Needs  . Financial resource strain: Not on file  . Food insecurity - worry: Not on file  . Food insecurity - inability: Not on file  . Transportation needs - medical: Not on file  . Transportation needs - non-medical: Not on file  Occupational History  . Not on file  Tobacco Use  . Smoking status: Never Smoker  . Smokeless tobacco: Never Used  Substance and Sexual Activity  . Alcohol use: No    Frequency: Never  . Drug use: Yes    Types: Marijuana    Comment: LAST TIME ON 12-30-2017  . Sexual activity: Not on file  Other Topics Concern  . Not on file  Social History Narrative  . Not on file   No current facility-administered medications on file prior to  encounter.    Current Outpatient Medications on File Prior to Encounter  Medication Sig Dispense Refill  . Prenatal Vit-Fe Fumarate-FA (PRENATAL COMPLETE) 14-0.4 MG TABS Take 1 tablet by mouth daily. 60 each 0  . promethazine (PHENERGAN) 25 MG tablet Take 0.5-1 tablets (12.5-25 mg total) by mouth every 6 (six) hours as needed. 30 tablet 2   Allergies  Allergen Reactions  . Penicillins Rash    Has patient had a PCN reaction causing immediate rash, facial/tongue/throat swelling, SOB or lightheadedness with hypotension: YES Has patient had a PCN reaction causing severe rash involving mucus membranes or skin necrosis: NO Has patient had a PCN reaction that required hospitalization: UNK Has patient had a PCN reaction occurring within the last 10 years: NO If all of the above answers are "NO", then may proceed with Cephalosporin use.    I have reviewed patient's Past Medical Hx, Surgical Hx, Family Hx, Social Hx, medications and allergies.   ROS:  Review of Systems  Constitutional: Negative for chills and fever.  Respiratory: Negative for cough.   Gastrointestinal: Positive for vomiting. Negative for abdominal pain and diarrhea.  Neurological: Positive for dizziness.   Review of Systems  Other systems negative   Physical Exam  Physical Exam Patient Vitals for the past 24 hrs:  Temp Height Weight  01/07/18  2134 98.2 F (36.8 C) 5\' 7"  (1.702 m) 269 lb 12 oz (122.4 kg)   Constitutional: Well-developed, well-nourished female in no acute distress.  Cardiovascular: normal rate Respiratory: normal effort GI: Abd soft, non-tender. Pos BS x 4 MS: Extremities nontender, no edema, normal ROM Neurologic: Alert and oriented x 4.  GU: Neg CVAT.  PELVIC EXAM: deferred   LAB RESULTS Results for orders placed or performed during the hospital encounter of 01/07/18 (from the past 24 hour(s))  Urinalysis, Routine w reflex microscopic     Status: Abnormal   Collection Time: 01/07/18  9:13 PM   Result Value Ref Range   Color, Urine YELLOW YELLOW   APPearance HAZY (A) CLEAR   Specific Gravity, Urine 1.031 (H) 1.005 - 1.030   pH 7.0 5.0 - 8.0   Glucose, UA NEGATIVE NEGATIVE mg/dL   Hgb urine dipstick NEGATIVE NEGATIVE   Bilirubin Urine NEGATIVE NEGATIVE   Ketones, ur 20 (A) NEGATIVE mg/dL   Protein, ur 30 (A) NEGATIVE mg/dL   Nitrite NEGATIVE NEGATIVE   Leukocytes, UA NEGATIVE NEGATIVE   RBC / HPF 0-5 0 - 5 RBC/hpf   WBC, UA 0-5 0 - 5 WBC/hpf   Bacteria, UA NONE SEEN NONE SEEN   Squamous Epithelial / LPF 0-5 (A) NONE SEEN   Mucus PRESENT   Pregnancy, urine POC     Status: Abnormal   Collection Time: 01/07/18  9:23 PM  Result Value Ref Range   Preg Test, Ur POSITIVE (A) NEGATIVE       IMAGING   MAU Management/MDM: IV started Administered 2 liters of fluid with Phenergan Patient was able to keep down fluids afterward and felt better Reviewed $4 list.at [facility]   ASSESSMENT Single IUP at [redacted]w[redacted]d .Non-intractable vomiting with nausea, unspecified vomiting type - Plan: Discharge patient, Discharge patient   PLAN Discharge home Has rx for Phenergan Advance diet as tolerated  Pt stable at time of discharge. Encouraged to return here or to other Urgent Care/ED if she develops worsening of symptoms, increase in pain, fever, or other concerning symptoms.    Wynelle Bourgeois CNM, MSN Certified Nurse-Midwife 01/07/2018  9:36 PM

## 2018-01-07 NOTE — MAU Note (Signed)
PO ICE 

## 2018-01-07 NOTE — MAU Note (Signed)
B-ROOM 

## 2018-01-08 MED ORDER — VITAMIN B-6 50 MG PO TABS: 50.0000 mg | ORAL_TABLET | Freq: Two times a day (BID) | ORAL | 1 refills | Status: AC

## 2018-01-08 NOTE — Discharge Instructions (Signed)
Morning Sickness °Morning sickness is when you feel sick to your stomach (nauseous) during pregnancy. This nauseous feeling may or may not come with vomiting. It often occurs in the morning but can be a problem any time of day. Morning sickness is most common during the first trimester, but it may continue throughout pregnancy. While morning sickness is unpleasant, it is usually harmless unless you develop severe and continual vomiting (hyperemesis gravidarum). This condition requires more intense treatment. °What are the causes? °The cause of morning sickness is not completely known but seems to be related to normal hormonal changes that occur in pregnancy. °What increases the risk? °You are at greater risk if you: °· Experienced nausea or vomiting before your pregnancy. °· Had morning sickness during a previous pregnancy. °· Are pregnant with more than one baby, such as twins. ° °How is this treated? °Do not use any medicines (prescription, over-the-counter, or herbal) for morning sickness without first talking to your health care provider. Your health care provider may prescribe or recommend: °· Vitamin B6 supplements. °· Anti-nausea medicines. °· The herbal medicine ginger. ° °Follow these instructions at home: °· Only take over-the-counter or prescription medicines as directed by your health care provider. °· Taking multivitamins before getting pregnant can prevent or decrease the severity of morning sickness in most women. °· Eat a piece of dry toast or unsalted crackers before getting out of bed in the morning. °· Eat five or six small meals a day. °· Eat dry and bland foods (rice, baked potato). Foods high in carbohydrates are often helpful. °· Do not drink liquids with your meals. Drink liquids between meals. °· Avoid greasy, fatty, and spicy foods. °· Get someone to cook for you if the smell of any food causes nausea and vomiting. °· If you feel nauseous after taking prenatal vitamins, take the vitamins at  night or with a snack. °· Snack on protein foods (nuts, yogurt, cheese) between meals if you are hungry. °· Eat unsweetened gelatins for desserts. °· Wearing an acupressure wristband (worn for sea sickness) may be helpful. °· Acupuncture may be helpful. °· Do not smoke. °· Get a humidifier to keep the air in your house free of odors. °· Get plenty of fresh air. °Contact a health care provider if: °· Your home remedies are not working, and you need medicine. °· You feel dizzy or lightheaded. °· You are losing weight. °Get help right away if: °· You have persistent and uncontrolled nausea and vomiting. °· You pass out (faint). °This information is not intended to replace advice given to you by your health care provider. Make sure you discuss any questions you have with your health care provider. °Document Released: 01/15/2007 Document Revised: 05/01/2016 Document Reviewed: 05/11/2013 °[employer] Interactive Patient Education © 2017 [employer] ° °

## 2018-02-02 ENCOUNTER — Encounter: Payer: Self-pay | Admitting: Obstetrics & Gynecology

## 2018-02-02 ENCOUNTER — Ambulatory Visit (INDEPENDENT_AMBULATORY_CARE_PROVIDER_SITE_OTHER): Payer: Self-pay | Admitting: Obstetrics & Gynecology

## 2018-02-02 ENCOUNTER — Other Ambulatory Visit: Payer: Self-pay | Admitting: Obstetrics & Gynecology

## 2018-02-02 ENCOUNTER — Other Ambulatory Visit: Payer: Self-pay

## 2018-02-02 VITALS — BP 137/77 | HR 126 | Wt 273.5 lb

## 2018-02-02 DIAGNOSIS — Z3400 Encounter for supervision of normal first pregnancy, unspecified trimester: Secondary | ICD-10-CM | POA: Insufficient documentation

## 2018-02-02 DIAGNOSIS — Z124 Encounter for screening for malignant neoplasm of cervix: Secondary | ICD-10-CM

## 2018-02-02 DIAGNOSIS — Z113 Encounter for screening for infections with a predominantly sexual mode of transmission: Secondary | ICD-10-CM | POA: Diagnosis not present

## 2018-02-02 DIAGNOSIS — Z3481 Encounter for supervision of other normal pregnancy, first trimester: Secondary | ICD-10-CM

## 2018-02-02 LAB — POCT URINALYSIS DIP (DEVICE)
BILIRUBIN URINE: NEGATIVE
GLUCOSE, UA: NEGATIVE mg/dL
Hgb urine dipstick: NEGATIVE
Ketones, ur: NEGATIVE mg/dL
LEUKOCYTES UA: NEGATIVE
Nitrite: NEGATIVE
Protein, ur: NEGATIVE mg/dL
SPECIFIC GRAVITY, URINE: 1.02 (ref 1.005–1.030)
UROBILINOGEN UA: 0.2 mg/dL (ref 0.0–1.0)
pH: 7 (ref 5.0–8.0)

## 2018-02-02 NOTE — Patient Instructions (Signed)
First Trimester of Pregnancy The first trimester of pregnancy is from week 1 until the end of week 13 (months 1 through 3). A week after a sperm fertilizes an egg, the egg will implant on the wall of the uterus. This embryo will begin to develop into a baby. Genes from you and your partner will form the baby. The female genes will determine whether the baby will be a boy or a girl. At 6-8 weeks, the eyes and face will be formed, and the heartbeat can be seen on ultrasound. At the end of 12 weeks, all the baby's organs will be formed. Now that you are pregnant, you will want to do everything you can to have a healthy baby. Two of the most important things are to get good prenatal care and to follow your health care provider's instructions. Prenatal care is all the medical care you receive before the baby's birth. This care will help prevent, find, and treat any problems during the pregnancy and childbirth. Body changes during your first trimester Your body goes through many changes during pregnancy. The changes vary from woman to woman.  You may gain or lose a couple of pounds at first.  You may feel sick to your stomach (nauseous) and you may throw up (vomit). If the vomiting is uncontrollable, call your health care provider.  You may tire easily.  You may develop headaches that can be relieved by medicines. All medicines should be approved by your health care provider.  You may urinate more often. Painful urination may mean you have a bladder infection.  You may develop heartburn as a result of your pregnancy.  You may develop constipation because certain hormones are causing the muscles that push stool through your intestines to slow down.  You may develop hemorrhoids or swollen veins (varicose veins).  Your breasts may begin to grow larger and become tender. Your nipples may stick out more, and the tissue that surrounds them (areola) may become darker.  Your gums may bleed and may be  sensitive to brushing and flossing.  Dark spots or blotches (chloasma, mask of pregnancy) may develop on your face. This will likely fade after the baby is born.  Your menstrual periods will stop.  You may have a loss of appetite.  You may develop cravings for certain kinds of food.  You may have changes in your emotions from day to day, such as being excited to be pregnant or being concerned that something may go wrong with the pregnancy and baby.  You may have more vivid and strange dreams.  You may have changes in your hair. These can include thickening of your hair, rapid growth, and changes in texture. Some women also have hair loss during or after pregnancy, or hair that feels dry or thin. Your hair will most likely return to normal after your baby is born.  What to expect at prenatal visits During a routine prenatal visit:  You will be weighed to make sure you and the baby are growing normally.  Your blood pressure will be taken.  Your abdomen will be measured to track your baby's growth.  The fetal heartbeat will be listened to between weeks 10 and 14 of your pregnancy.  Test results from any previous visits will be discussed.  Your health care provider may ask you:  How you are feeling.  If you are feeling the baby move.  If you have had any abnormal symptoms, such as leaking fluid, bleeding, severe headaches,   or abdominal cramping.  If you are using any tobacco products, including cigarettes, chewing tobacco, and electronic cigarettes.  If you have any questions.  Other tests that may be performed during your first trimester include:  Blood tests to find your blood type and to check for the presence of any previous infections. The tests will also be used to check for low iron levels (anemia) and protein on red blood cells (Rh antibodies). Depending on your risk factors, or if you previously had diabetes during pregnancy, you may have tests to check for high blood  sugar that affects pregnant women (gestational diabetes).  Urine tests to check for infections, diabetes, or protein in the urine.  An ultrasound to confirm the proper growth and development of the baby.  Fetal screens for spinal cord problems (spina bifida) and Down syndrome.  HIV (human immunodeficiency virus) testing. Routine prenatal testing includes screening for HIV, unless you choose not to have this test.  You may need other tests to make sure you and the baby are doing well.  Follow these instructions at home: Medicines  Follow your health care provider's instructions regarding medicine use. Specific medicines may be either safe or unsafe to take during pregnancy.  Take a prenatal vitamin that contains at least 600 micrograms (mcg) of folic acid.  If you develop constipation, try taking a stool softener if your health care provider approves. Eating and drinking  Eat a balanced diet that includes fresh fruits and vegetables, whole grains, good sources of protein such as meat, eggs, or tofu, and low-fat dairy. Your health care provider will help you determine the amount of weight gain that is right for you.  Avoid raw meat and uncooked cheese. These carry germs that can cause birth defects in the baby.  Eating four or five small meals rather than three large meals a day may help relieve nausea and vomiting. If you start to feel nauseous, eating a few soda crackers can be helpful. Drinking liquids between meals, instead of during meals, also seems to help ease nausea and vomiting.  Limit foods that are high in fat and processed sugars, such as fried and sweet foods.  To prevent constipation: ? Eat foods that are high in fiber, such as fresh fruits and vegetables, whole grains, and beans. ? Drink enough fluid to keep your urine clear or pale yellow. Activity  Exercise only as directed by your health care provider. Most women can continue their usual exercise routine during  pregnancy. Try to exercise for 30 minutes at least 5 days a week. Exercising will help you: ? Control your weight. ? Stay in shape. ? Be prepared for labor and delivery.  Experiencing pain or cramping in the lower abdomen or lower back is a good sign that you should stop exercising. Check with your health care provider before continuing with normal exercises.  Try to avoid standing for long periods of time. Move your legs often if you must stand in one place for a long time.  Avoid heavy lifting.  Wear low-heeled shoes and practice good posture.  You may continue to have sex unless your health care provider tells you not to. Relieving pain and discomfort  Wear a good support bra to relieve breast tenderness.  Take warm sitz baths to soothe any pain or discomfort caused by hemorrhoids. Use hemorrhoid cream if your health care provider approves.  Rest with your legs elevated if you have leg cramps or low back pain.  If you develop   varicose veins in your legs, wear support hose. Elevate your feet for 15 minutes, 3-4 times a day. Limit salt in your diet. Prenatal care  Schedule your prenatal visits by the twelfth week of pregnancy. They are usually scheduled monthly at first, then more often in the last 2 months before delivery.  Write down your questions. Take them to your prenatal visits.  Keep all your prenatal visits as told by your health care provider. This is important. Safety  Wear your seat belt at all times when driving.  Make a list of emergency phone numbers, including numbers for family, friends, the hospital, and police and fire departments. General instructions  Ask your health care provider for a referral to a local prenatal education class. Begin classes no later than the beginning of month 6 of your pregnancy.  Ask for help if you have counseling or nutritional needs during pregnancy. Your health care provider can offer advice or refer you to specialists for help  with various needs.  Do not use hot tubs, steam rooms, or saunas.  Do not douche or use tampons or scented sanitary pads.  Do not cross your legs for long periods of time.  Avoid cat litter boxes and soil used by cats. These carry germs that can cause birth defects in the baby and possibly loss of the fetus by miscarriage or stillbirth.  Avoid all smoking, herbs, alcohol, and medicines not prescribed by your health care provider. Chemicals in these products affect the formation and growth of the baby.  Do not use any products that contain nicotine or tobacco, such as cigarettes and e-cigarettes. If you need help quitting, ask your health care provider. You may receive counseling support and other resources to help you quit.  Schedule a dentist appointment. At home, brush your teeth with a soft toothbrush and be gentle when you floss. Contact a health care provider if:  You have dizziness.  You have mild pelvic cramps, pelvic pressure, or nagging pain in the abdominal area.  You have persistent nausea, vomiting, or diarrhea.  You have a bad smelling vaginal discharge.  You have pain when you urinate.  You notice increased swelling in your face, hands, legs, or ankles.  You are exposed to fifth disease or chickenpox.  You are exposed to German measles (rubella) and have never had it. Get help right away if:  You have a fever.  You are leaking fluid from your vagina.  You have spotting or bleeding from your vagina.  You have severe abdominal cramping or pain.  You have rapid weight gain or loss.  You vomit blood or material that looks like coffee grounds.  You develop a severe headache.  You have shortness of breath.  You have any kind of trauma, such as from a fall or a car accident. Summary  The first trimester of pregnancy is from week 1 until the end of week 13 (months 1 through 3).  Your body goes through many changes during pregnancy. The changes vary from  woman to woman.  You will have routine prenatal visits. During those visits, your health care provider will examine you, discuss any test results you may have, and talk with you about how you are feeling. This information is not intended to replace advice given to you by your health care provider. Make sure you discuss any questions you have with your health care provider. Document Released: 11/18/2001 Document Revised: 11/05/2016 Document Reviewed: 11/05/2016 Elsevier Interactive Patient Education  2018 Else[employer]   Inc[employer]  

## 2018-02-02 NOTE — Progress Notes (Signed)
  Subjective:needs TOC chlamydia    Laura Estrada is a G2P0010 580w2d being seen today for her first obstetrical visit.  Her obstetrical history is significant for obesity. Patient does intend to breast feed. Pregnancy history fully reviewed.  Patient reports vomiting.  Vitals:   02/02/18 0827  BP: 137/77  Pulse: (!) 126  Weight: 273 lb 8 oz (124.1 kg)    HISTORY: OB History  Gravida Para Term Preterm AB Living  2       1    SAB TAB Ectopic Multiple Live Births  1            # Outcome Date GA Lbr Len/2nd Weight Sex Delivery Anes PTL Lv  2 Current           1 SAB 12/02/14 3665w0d            Past Medical History:  Diagnosis Date  . Back pain   . Complication of anesthesia    woke up during wisdom teeth extraction   Past Surgical History:  Procedure Laterality Date  . ANKLE SURGERY    . WISDOM TOOTH EXTRACTION     Family History  Problem Relation Age of Onset  . Hypertension Maternal Grandmother   . Hernia Paternal Grandmother        passed away d/t sepsis  . Parkinson's disease Paternal Grandmother   . Cancer Paternal Grandfather      Exam    Uterus:   10 week  Pelvic Exam:    Perineum: No Hemorrhoids   Vulva: normal   Vagina:  normal mucosa   pH:     Cervix: no lesions   Adnexa: normal adnexa and no mass, fullness, tenderness   Bony Pelvis: average  System: Breast:  normal appearance, no masses or tenderness   Skin: normal coloration and turgor, no rashes    Neurologic: normal, normal mood   Extremities: normal strength, tone, and muscle mass   HEENT PERRLA, neck supple with midline trachea and thyroid without masses   Mouth/Teeth mucous membranes moist, pharynx normal without lesions and dental hygiene good   Neck supple and no masses   Cardiovascular: regular rate and rhythm, no murmurs or gallops   Respiratory:  appears well, vitals normal, no respiratory distress, acyanotic, normal RR, neck free of mass or lymphadenopathy, chest clear, no wheezing,  crepitations, rhonchi, normal symmetric air entry   Abdomen: soft, non-tender; bowel sounds normal; no masses,  no organomegaly   Urinary: urethral meatus normal      Assessment:    Pregnancy: G2P0010 Patient Active Problem List   Diagnosis Date Noted  . Supervision of low-risk first pregnancy 02/02/2018  . Chlamydia infection affecting pregnancy 12/24/2017        Plan:     Initial labs drawn. Prenatal vitamins. Problem list reviewed and updated. Genetic Screening discussedordered.Panorama Ultrasound discussed; fetal survey: 18 weeks.  Follow up in 4 weeks. 50% of 30 min visit spent on counseling and coordination of care.     Scheryl DarterJames Ailani Governale 02/02/2018

## 2018-02-02 NOTE — Progress Notes (Signed)
FHR obtained with bedside US via M-mode. 

## 2018-02-03 LAB — CYTOLOGY - PAP
CHLAMYDIA, DNA PROBE: NEGATIVE
Diagnosis: NEGATIVE
NEISSERIA GONORRHEA: NEGATIVE

## 2018-02-04 ENCOUNTER — Inpatient Hospital Stay (HOSPITAL_COMMUNITY)
Admission: AD | Admit: 2018-02-04 | Discharge: 2018-02-04 | Disposition: A | Discharge status: Home / Self Care | Payer: Medicaid Other | Source: Ambulatory Visit | Attending: Obstetrics and Gynecology | Admitting: Obstetrics and Gynecology

## 2018-02-04 ENCOUNTER — Encounter: Payer: Self-pay | Admitting: Obstetrics & Gynecology

## 2018-02-04 ENCOUNTER — Encounter (HOSPITAL_COMMUNITY): Payer: Self-pay

## 2018-02-04 ENCOUNTER — Other Ambulatory Visit: Payer: Self-pay

## 2018-02-04 DIAGNOSIS — O210 Mild hyperemesis gravidarum: Secondary | ICD-10-CM

## 2018-02-04 DIAGNOSIS — Z87891 Personal history of nicotine dependence: Secondary | ICD-10-CM | POA: Insufficient documentation

## 2018-02-04 DIAGNOSIS — Z880 Allergy status to penicillin: Secondary | ICD-10-CM | POA: Insufficient documentation

## 2018-02-04 DIAGNOSIS — Z3A10 10 weeks gestation of pregnancy: Secondary | ICD-10-CM | POA: Diagnosis not present

## 2018-02-04 LAB — URINALYSIS, ROUTINE W REFLEX MICROSCOPIC
Bilirubin Urine: NEGATIVE
GLUCOSE, UA: NEGATIVE mg/dL
HGB URINE DIPSTICK: NEGATIVE
Ketones, ur: NEGATIVE mg/dL
LEUKOCYTES UA: NEGATIVE
NITRITE: NEGATIVE
PROTEIN: 30 mg/dL — AB
SPECIFIC GRAVITY, URINE: 1.023 (ref 1.005–1.030)
pH: 8 (ref 5.0–8.0)

## 2018-02-04 NOTE — MAU Provider Note (Signed)
History     CSN: [id number]  Arrival date and time: 02/04/18 1138   First Provider Initiated Contact with Patient 02/04/18 1228      Chief Complaint  Patient presents with  . Emesis   HPI Laura Estrada 22 y.o.  Comes to MAU today with repetitive vomiting.  Has phenergan at home.  Does not have her Medicaid card yet.  Does not like phenergan as it makes her sleepy.  Is able to drink Sprite, juices, and eat yogurt.  Notices some traces of blood when vomiting but does not have full red vomitus.  Has soft stools but no watery diarrhea.     OB History    Gravida Para Term Preterm AB Living   2       1     SAB TAB Ectopic Multiple Live Births   1              Past Medical History:  Diagnosis Date  . Back pain   . Complication of anesthesia    woke up during wisdom teeth extraction    Past Surgical History:  Procedure Laterality Date  . ANKLE SURGERY    . WISDOM TOOTH EXTRACTION      Family History  Problem Relation Age of Onset  . Hypertension Maternal Grandmother   . Hernia Paternal Grandmother        passed away d/t sepsis  . Parkinson's disease Paternal Grandmother   . Cancer Paternal Grandfather     Social History   Tobacco Use  . Smoking status: Former Games developermoker  . Smokeless tobacco: Never Used  Substance Use Topics  . Alcohol use: No    Frequency: Never  . Drug use: Yes    Types: Marijuana    Comment: LAST TIME ON 12-30-2017    Allergies:  Allergies  Allergen Reactions  . Penicillins Rash    Has patient had a PCN reaction causing immediate rash, facial/tongue/throat swelling, SOB or lightheadedness with hypotension: YES Has patient had a PCN reaction causing severe rash involving mucus membranes or skin necrosis: NO Has patient had a PCN reaction that required hospitalization: UNK Has patient had a PCN reaction occurring within the last 10 years: NO If all of the above answers are "NO", then may proceed with Cephalosporin use.    Medications Prior  to Admission  Medication Sig Dispense Refill Last Dose  . Prenatal Vit-Fe Fumarate-FA (PRENATAL COMPLETE) 14-0.4 MG TABS Take 1 tablet by mouth daily. 60 each 0 Taking  . promethazine (PHENERGAN) 25 MG tablet Take 0.5-1 tablets (12.5-25 mg total) by mouth every 6 (six) hours as needed. 30 tablet 2 Taking  . pyridOXINE (VITAMIN B-6) 50 MG tablet Take 1 tablet (50 mg total) by mouth 2 (two) times daily. (Patient not taking: Reported on 02/02/2018) 60 tablet 1 Not Taking    Review of Systems  Constitutional: Negative for fever.  Gastrointestinal: Positive for abdominal pain, diarrhea, nausea and vomiting.  Genitourinary: Negative for dysuria, vaginal bleeding and vaginal discharge.   Physical Exam   Blood pressure 135/77, pulse 97, temperature 98.8 F (37.1 C), resp. rate 18, height 5\' 7"  (1.702 m), weight 273 lb (123.8 kg), last menstrual period 11/11/2017.  Physical Exam  Nursing note and vitals reviewed. Constitutional: She is oriented to person, place, and time. She appears well-developed and well-nourished.  HENT:  Head: Normocephalic.  Eyes: EOM are normal.  Neck: Neck supple.  Cardiovascular: Normal rate.  Respiratory: Effort normal.  Musculoskeletal: Normal range of motion.  Neurological: She is alert and oriented to person, place, and time.  Skin: Skin is warm and dry.  Psychiatric: She has a normal mood and affect.    MAU Course  Procedures Results for orders placed or performed during the hospital encounter of 02/04/18 (from the past 24 hour(s))  Urinalysis, Routine w reflex microscopic     Status: Abnormal   Collection Time: 02/04/18 11:50 AM  Result Value Ref Range   Color, Urine YELLOW YELLOW   APPearance CLOUDY (A) CLEAR   Specific Gravity, Urine 1.023 1.005 - 1.030   pH 8.0 5.0 - 8.0   Glucose, UA NEGATIVE NEGATIVE mg/dL   Hgb urine dipstick NEGATIVE NEGATIVE   Bilirubin Urine NEGATIVE NEGATIVE   Ketones, ur NEGATIVE NEGATIVE mg/dL   Protein, ur 30 (A)  NEGATIVE mg/dL   Nitrite NEGATIVE NEGATIVE   Leukocytes, UA NEGATIVE NEGATIVE   RBC / HPF 0-5 0 - 5 RBC/hpf   WBC, UA 0-5 0 - 5 WBC/hpf   Bacteria, UA RARE (A) NONE SEEN   Squamous Epithelial / LPF 0-5 (A) NONE SEEN   Mucus PRESENT     MDM No ketones in urine.  When talking with client, she is able to take in food periodically.  Able to take in fluids.  Reviewed weights from 01-01-18 to present.  Is not losing weight. Client had been worried as she remembers her weight as 288 but no evidence of that weight found in her chart.  Discussed expectations for now and client is relieved.  When she has an insurance card, she can try to switch to Diclegis if she is still having problems with vomiting.  Assessment and Plan  Morning Sickness  Plan Vitamin B6 50 mg tablets and Unisom (Plain) - take one of each at bedtime and again in the morning. Continue to take small amounts of fluids every hour. If vomiting, try Bananas, applesauce, rice and toast.  Do not eat fried foods, do not eat oils. Eat small amounts every 3 hours. When you get your insurance card, let the office know if you need Diclegis. Continue to use the promethazine as prescribed to help control the vomiting if you need more than the B6 and Unisom.   Laura Estrada 02/04/2018, 12:58 PM

## 2018-02-04 NOTE — Progress Notes (Signed)
G2P0 @ 10.[redacted] wksga. Here dt n/v/d that started since 6 wksgo. Today had little bleeding in the emesis so decided to come in. Pt states informed Dr. Debroah LoopArnold regards to n/v/d but states "he did not say anything about it".

## 2018-02-04 NOTE — Discharge Instructions (Signed)
Vitamin B6 50 mg tablets and Unisom (Plain) - take one of each at bedtime and again in the morning. Continue to take small amounts of fluids every hour. If vomiting, try Bananas, applesauce, rice and toast.  Do not eat fried foods, do not eat oils. Eat small amounts every 3 hours. When you get your insurance card, let the office know if you need Diclegis. Continue to use the promethazine as prescribed to help control the vomiting if you need more than the B6 and Unisom.

## 2018-02-04 NOTE — MAU Note (Signed)
Pt reports she has had vomiting off and on and noticed some blood in her emesis and is worried.  Takes promethazine for nausea and it helps some. Does not  Take during the day due to drowsiness.

## 2018-02-05 ENCOUNTER — Encounter: Payer: Self-pay | Admitting: *Deleted

## 2018-02-10 LAB — HEMOGLOBIN A1C
Est. average glucose Bld gHb Est-mCnc: 114 mg/dL
HEMOGLOBIN A1C: 5.6 % (ref 4.8–5.6)

## 2018-02-10 LAB — OBSTETRIC PANEL, INCLUDING HIV
Antibody Screen: NEGATIVE
BASOS ABS: 0 10*3/uL (ref 0.0–0.2)
Basos: 0 %
EOS (ABSOLUTE): 0.1 10*3/uL (ref 0.0–0.4)
EOS: 1 %
HEMOGLOBIN: 11.7 g/dL (ref 11.1–15.9)
HEP B S AG: NEGATIVE
HIV Screen 4th Generation wRfx: NONREACTIVE
Hematocrit: 35.2 % (ref 34.0–46.6)
IMMATURE GRANS (ABS): 0 10*3/uL (ref 0.0–0.1)
IMMATURE GRANULOCYTES: 0 %
LYMPHS: 20 %
Lymphocytes Absolute: 2.5 10*3/uL (ref 0.7–3.1)
MCH: 26.8 pg (ref 26.6–33.0)
MCHC: 33.2 g/dL (ref 31.5–35.7)
MCV: 81 fL (ref 79–97)
MONOCYTES: 4 %
Monocytes Absolute: 0.5 10*3/uL (ref 0.1–0.9)
NEUTROS PCT: 75 %
Neutrophils Absolute: 9.2 10*3/uL — ABNORMAL HIGH (ref 1.4–7.0)
PLATELETS: 327 10*3/uL (ref 150–379)
RBC: 4.36 x10E6/uL (ref 3.77–5.28)
RDW: 17.6 % — ABNORMAL HIGH (ref 12.3–15.4)
RPR: NONREACTIVE
RUBELLA: 3.65 {index} (ref >0.99)
Rh Factor: NEGATIVE
WBC: 12.3 10*3/uL — ABNORMAL HIGH (ref 3.4–10.8)

## 2018-02-10 LAB — HEMOGLOBINOPATHY EVALUATION
FERRITIN: 19 ng/mL (ref 15–150)
HGB A2 QUANT: 2.1 % (ref 1.8–3.2)
HGB SOLUBILITY: NEGATIVE
HGB VARIANT: 0 %
Hgb A: 97.9 % (ref 96.4–98.8)
Hgb C: 0 %
Hgb F Quant: 0 % (ref 0.0–2.0)
Hgb S: 0 %

## 2018-02-10 LAB — SMN1 COPY NUMBER ANALYSIS (SMA CARRIER SCREENING)

## 2018-02-10 LAB — CYSTIC FIBROSIS GENE TEST

## 2018-02-11 LAB — URINE CULTURE, OB REFLEX

## 2018-02-11 LAB — CULTURE, OB URINE

## 2018-02-12 ENCOUNTER — Encounter: Payer: Self-pay | Admitting: Obstetrics & Gynecology

## 2018-02-16 ENCOUNTER — Encounter: Payer: Self-pay | Admitting: *Deleted

## 2018-02-16 ENCOUNTER — Other Ambulatory Visit: Payer: Self-pay | Admitting: Obstetrics & Gynecology

## 2018-02-16 DIAGNOSIS — O99891 Other specified diseases and conditions complicating pregnancy: Secondary | ICD-10-CM

## 2018-02-16 DIAGNOSIS — O9989 Other specified diseases and conditions complicating pregnancy, childbirth and the puerperium: Principal | ICD-10-CM

## 2018-02-16 DIAGNOSIS — R8271 Bacteriuria: Secondary | ICD-10-CM

## 2018-02-16 MED ORDER — NITROFURANTOIN MONOHYD MACRO 100 MG PO CAPS: 100.0000 mg | ORAL_CAPSULE | Freq: Two times a day (BID) | ORAL | 1 refills | Status: DC

## 2018-03-02 ENCOUNTER — Ambulatory Visit (INDEPENDENT_AMBULATORY_CARE_PROVIDER_SITE_OTHER): Payer: Self-pay | Admitting: Medical

## 2018-03-02 ENCOUNTER — Encounter: Payer: Self-pay | Admitting: Medical

## 2018-03-02 ENCOUNTER — Encounter: Payer: Self-pay | Admitting: *Deleted

## 2018-03-02 VITALS — BP 110/55 | HR 88 | Wt 276.8 lb

## 2018-03-02 DIAGNOSIS — O09892 Supervision of other high risk pregnancies, second trimester: Secondary | ICD-10-CM

## 2018-03-02 DIAGNOSIS — A749 Chlamydial infection, unspecified: Secondary | ICD-10-CM

## 2018-03-02 DIAGNOSIS — O98811 Other maternal infectious and parasitic diseases complicating pregnancy, first trimester: Secondary | ICD-10-CM

## 2018-03-02 DIAGNOSIS — O26892 Other specified pregnancy related conditions, second trimester: Secondary | ICD-10-CM

## 2018-03-02 DIAGNOSIS — Z6791 Unspecified blood type, Rh negative: Secondary | ICD-10-CM

## 2018-03-02 DIAGNOSIS — Z3400 Encounter for supervision of normal first pregnancy, unspecified trimester: Secondary | ICD-10-CM

## 2018-03-02 NOTE — Progress Notes (Signed)
   PRENATAL VISIT NOTE  Subjective:  Laura Estrada is a 22 y.o. G2P0010 at 9629w2d being seen today for ongoing prenatal care.  She is currently monitored for the following issues for this low-risk pregnancy and has Chlamydia infection affecting pregnancy; Supervision of low-risk first pregnancy; and Rh negative state in antepartum period, second trimester on their problem list.  Patient reports no complaints.  Contractions: Not present. Vag. Bleeding: None.  Movement: Present. Denies leaking of fluid.   The following portions of the patient's history were reviewed and updated as appropriate: allergies, current medications, past family history, past medical history, past social history, past surgical history and problem list. Problem list updated.  Objective:   Vitals:   03/02/18 1136  BP: (!) 110/55  Pulse: 88  Weight: 276 lb 12.8 oz (125.6 kg)    Fetal Status: Fetal Heart Rate (bpm): 150   Movement: Present     General:  Alert, oriented and cooperative. Patient is in no acute distress.  Skin: Skin is warm and dry. No rash noted.   Cardiovascular: Normal heart rate noted  Respiratory: Normal respiratory effort, no problems with respiration noted  Abdomen: Soft, gravid, appropriate for gestational age.  Pain/Pressure: Absent     Pelvic: Cervical exam deferred        Extremities: Normal range of motion.  Edema: None  Mental Status:  Normal mood and affect. Normal behavior. Normal judgment and thought content.   Assessment and Plan:  Pregnancy: G2P0010 at [redacted]w[redacted]d  1. Encounter for supervision of low-risk first pregnancy, antepartum - Genetic Screening - invalid results with last attempt (likely too early) - repeated today   2. Chlamydia infection affecting pregnancy in first trimester - [facility] and negative TOC at last visit 02/02/18  3. Rh negative state in antepartum period, second trimester - Patient aware of these results and need for Rhogam at  28 weeks   Preterm labor/ second trimester warning symptoms and general obstetric precautions including but not limited to vaginal bleeding, contractions, leaking of fluid and fetal movement were reviewed in detail with the patient. Please refer to After Visit Summary for other counseling recommendations.  Return in about 1 month (around 03/30/2018) for LOB.   Vonzella NippleJulie Jasmyne Lodato, PA-C

## 2018-03-02 NOTE — Patient Instructions (Signed)
Second Trimester of Pregnancy The second trimester is from week 13 through week 28, month 4 through 6. This is often the time in pregnancy that you feel your best. Often times, morning sickness has lessened or quit. You may have more energy, and you may get hungry more often. Your unborn baby (fetus) is growing rapidly. At the end of the sixth month, he or she is about 9 inches long and weighs about 1 pounds. You will likely feel the baby move (quickening) between 18 and 20 weeks of pregnancy. Follow these instructions at home:  Avoid all smoking, herbs, and alcohol. Avoid drugs not approved by your doctor.  Do not use any tobacco products, including cigarettes, chewing tobacco, and electronic cigarettes. If you need help quitting, ask your doctor. You may get counseling or other support to help you quit.  Only take medicine as told by your doctor. Some medicines are safe and some are not during pregnancy.  Exercise only as told by your doctor. Stop exercising if you start having cramps.  Eat regular, healthy meals.  Wear a good support bra if your breasts are tender.  Do not use hot tubs, steam rooms, or saunas.  Wear your seat belt when driving.  Avoid raw meat, uncooked cheese, and liter boxes and soil used by cats.  Take your prenatal vitamins.  Take 1500-2000 milligrams of calcium daily starting at the 20th week of pregnancy until you deliver your baby.  Try taking medicine that helps you poop (stool softener) as needed, and if your doctor approves. Eat more fiber by eating fresh fruit, vegetables, and whole grains. Drink enough fluids to keep your pee (urine) clear or pale yellow.  Take warm water baths (sitz baths) to soothe pain or discomfort caused by hemorrhoids. Use hemorrhoid cream if your doctor approves.  If you have puffy, bulging veins (varicose veins), wear support hose. Raise (elevate) your feet for 15 minutes, 3-4 times a day. Limit salt in your diet.  Avoid heavy  lifting, wear low heals, and sit up straight.  Rest with your legs raised if you have leg cramps or low back pain.  Visit your dentist if you have not gone during your pregnancy. Use a soft toothbrush to brush your teeth. Be gentle when you floss.  You can have sex (intercourse) unless your doctor tells you not to.  Go to your doctor visits. Get help if:  You feel dizzy.  You have mild cramps or pressure in your lower belly (abdomen).  You have a nagging pain in your belly area.  You continue to feel sick to your stomach (nauseous), throw up (vomit), or have watery poop (diarrhea).  You have bad smelling fluid coming from your vagina.  You have pain with peeing (urination). Get help right away if:  You have a fever.  You are leaking fluid from your vagina.  You have spotting or bleeding from your vagina.  You have severe belly cramping or pain.  You lose or gain weight rapidly.  You have trouble catching your breath and have chest pain.  You notice sudden or extreme puffiness (swelling) of your face, hands, ankles, feet, or legs.  You have not felt the baby move in over an hour.  You have severe headaches that do not go away with medicine.  You have vision changes. This information is not intended to replace advice given to you by your health care provider. Make sure you discuss any questions you have with your health care   provider. Document Released: 02/18/2010 Document Revised: 05/01/2016 Document Reviewed: 01/25/2013 Elsevier Interactive Patient Education  2017 Elsevier Inc.  

## 2018-03-08 ENCOUNTER — Encounter: Payer: Self-pay | Admitting: Medical

## 2018-03-16 ENCOUNTER — Encounter: Payer: Self-pay | Admitting: *Deleted

## 2018-03-30 ENCOUNTER — Ambulatory Visit (INDEPENDENT_AMBULATORY_CARE_PROVIDER_SITE_OTHER): Payer: Self-pay | Admitting: Obstetrics and Gynecology

## 2018-03-30 VITALS — BP 122/69 | HR 94 | Wt 273.4 lb

## 2018-03-30 DIAGNOSIS — Z6791 Unspecified blood type, Rh negative: Secondary | ICD-10-CM

## 2018-03-30 DIAGNOSIS — O98812 Other maternal infectious and parasitic diseases complicating pregnancy, second trimester: Secondary | ICD-10-CM

## 2018-03-30 DIAGNOSIS — Z3402 Encounter for supervision of normal first pregnancy, second trimester: Secondary | ICD-10-CM

## 2018-03-30 DIAGNOSIS — A749 Chlamydial infection, unspecified: Secondary | ICD-10-CM

## 2018-03-30 DIAGNOSIS — O26892 Other specified pregnancy related conditions, second trimester: Secondary | ICD-10-CM

## 2018-03-30 NOTE — Progress Notes (Signed)
   PRENATAL VISIT NOTE  Subjective:  Laura Estrada is a 22 y.o. G2P0010 at 5327w2d being seen today for ongoing prenatal care.  She is currently monitored for the following issues for this low-risk pregnancy and has Chlamydia infection affecting pregnancy; Supervision of low-risk first pregnancy; and Rh negative state in antepartum period, second trimester on their problem list.  Patient reports no complaints.  Contractions: Not present. Vag. Bleeding: None.  Movement: Present. Denies leaking of fluid.   The following portions of the patient's history were reviewed and updated as appropriate: allergies, current medications, past family history, past medical history, past social history, past surgical history and problem list. Problem list updated.  Objective:   Vitals:   03/30/18 1130  BP: 122/69  Pulse: 94  Weight: 273 lb 6.4 oz (124 kg)    Fetal Status: Fetal Heart Rate (bpm): 143    Movement: Present     General:  Alert, oriented and cooperative. Patient is in no acute distress.  Skin: Skin is warm and dry. No rash noted.   Cardiovascular: Normal heart rate noted  Respiratory: Normal respiratory effort, no problems with respiration noted  Abdomen: Soft, gravid, appropriate for gestational age.  Pain/Pressure: Absent     Pelvic: Cervical exam deferred        Extremities: Normal range of motion.  Edema: None  Mental Status: Normal mood and affect. Normal behavior. Normal judgment and thought content.   Assessment and Plan:  Pregnancy: G2P0010 at 927w2d  1. Encounter for supervision of low-risk first pregnancy in second trimester  - US MFM OB DETAIL +14 WK; Future - AFP, Serum, Open Spina Bifida  2. Rh negative state in antepartum period, second trimester  Rhogam at 28 weeks; info given to patient   3. Chlamydia infection affecting pregnancy in second trimester  TOC negative   There are no diagnoses linked to this encounter. Preterm labor symptoms and general obstetric  precautions including but not limited to vaginal bleeding, contractions, leaking of fluid and fetal movement were reviewed in detail with the patient. Please refer to After Visit Summary for other counseling recommendations.  No follow-ups on file.  Future Appointments  Date Time Provider Department Center  04/12/2018  7:45 AM [facility] US 2 [facility]    Venia CarbonJennifer Erice Ahles, NP

## 2018-03-30 NOTE — Patient Instructions (Addendum)
Rh Incompatibility Rh incompatibility is a condition that occurs during pregnancy if a woman has Rh-negative blood and her baby has Rh-positive blood. "Rh-negative" and "Rh-positive" refer to whether or not the blood has an Rh factor. An Rh factor is a specific protein found on the surface of red blood cells. If a woman has Rh factor, she is Rh-positive. If she does not have an Rh factor, she is Rh-negative. Having or not having an Rh factor does not affect the mother's general health. However, it can cause problems during pregnancy. What kind of problems can Rh incompatibility cause? During pregnancy, blood from the baby can cross into the mother's bloodstream, especially during delivery. If a mother is Rh-negative and the baby is Rh-positive, the mother's defense system will react to the baby's blood as if it was a foreign substance and will create proteins (antibodies). This is called sensitization. Once the mother is sensitized, her Rh antibodies will cross the placenta to the baby and attack the baby's Rh-positive blood as if it is a harmful substance. Rh incompatibility can also happen if the Rh-negative pregnant woman is exposed to the Rh factor during a blood transfusion with Rh-positive blood. How does this condition affect my baby? The Rh antibodies that attack and destroy the baby's red blood cells can lead to hemolytic disease in the baby. Hemolytic disease is when the red blood cells break down. This can cause:  Yellowing of the skin and eyes (jaundice).  The body to not have enough healthy red blood cells (anemia).  Brain damage.  Heart failure.  Death.  These antibodies usually do not cause problems during a first pregnancy. This is because the blood from the baby often times crosses into the mother's bloodstream during delivery, and the baby is born before many of the antibodies can develop. However, the antibodies stay in your body once they have formed. Because of this, Rh  incompatibility is more likely to cause problems in second or later pregnancies (if the baby is Rh-positive). How is this diagnosed? When a woman becomes pregnant, blood tests may be done to find out her blood type and Rh factor. If the woman is Rh-negative, she also may have another blood test called an antibody screen. The antibody screen shows whether she has Rh antibodies in her blood. If she does, it means she was exposed to Rh-positive blood before, and she is at risk for Rh incompatibility. To find out whether the baby is developing hemolytic anemia and how serious it is, caregivers may use more advanced tests, such as ultrasonography (commonly known as ultrasound). How is Rh incompatibility treated? Rh incompatibility is treated with a shot of medicine called Rho (D) immune globulin. This medicine keeps the woman's body from making antibodies that can cause serious problems in the baby or future babies. Two shots will be given, one at around your seventh month of pregnancy and the other within 72 hours of your baby being born. If you are Rh-negative, you will need this medicine every time you have a baby with Rh-positive blood. If you already have antibodies in your blood, Rho (D) immune globulin will not help. Your doctor will not give you this medicine, but will watch your pregnancy closely for problems instead. This shot may also be given to an Rh-negative woman when the risk of blood transfer between the mom and baby is high. The risk is high with:  An amniocentesis.  A miscarriage or an abortion.  An ectopic pregnancy.  Any  vaginal bleeding during pregnancy.  This information is not intended to replace advice given to you by your health care provider. Make sure you discuss any questions you have with your health care provider. Document Released: 05/16/2002 Document Revised: 05/01/2016 Document Reviewed: 03/08/2013 Elsevier Interactive Patient Education  2017 [place] Education Options: [facility] Department Classes:  Childbirth education classes can help you get ready for a positive parenting experience. You can also meet other expectant parents and get free stuff for your baby. Each class runs for five weeks on the same night and costs $45 for the mother-to-be and her support person. Medicaid covers the cost if you are eligible. Call [phone] to register. [facility] Childbirth Education:  [phone] or [phone] or [email or web address]  Baby & Me Class: Discuss newborn & infant parenting and family adjustment issues with other new mothers in a relaxed environment. Each week brings a new speaker or baby-centered activity. We encourage new mothers to join [place] every Thursday at 11:00am. Babies birth until crawling. No registration or fee. Daddy [employer]: This course offers Dads-to-be the tools and knowledge needed to feel confident on their journey to becoming new fathers. Experienced dads, who have been trained as coaches, teach dads-to-be how to hold, comfort, diaper, swaddle and play with their infant while being able to support the new mom as well. A class for men taught by men. $25/dad Big Brother/Big Sister: Let your children share in the joy of a new brother or sister in this special class designed just for them. Class includes discussion about how families care for babies: swaddling, holding, diapering, safety as well as how they can be helpful in their new role. This class is designed for children ages 21 to 8, but any age is welcome. Please register each child individually. $5/child  Mom Talk: This mom-led group offers support and connection to mothers as they journey through the adjustments and struggles of that sometimes overwhelming first year after the birth of a child. Tuesdays at 10:00am and Thursdays at 6:00pm. Babies welcome. No registration or fee. Breastfeeding Support Group: This group is a  mother-to-mother support circle where moms have the opportunity to share their breastfeeding experiences. A Lactation Consultant is present for questions and concerns. Meets each Tuesday at 11:00am. No fee or registration. Breastfeeding Your Baby: Learn what to expect in the first days of breastfeeding your newborn.  This class will help you feel more confident with the skills needed to begin your breastfeeding experience. Many new mothers are concerned about breastfeeding after leaving the hospital. This class will also address the most common fears and challenges about breastfeeding during the first few weeks, months and beyond. (call for fee) Comfort Techniques and Tour: This 2 hour interactive class will provide you the opportunity to learn & practice hands-on techniques that can help relieve some of the discomfort of labor and encourage your baby to rotate toward the best position for birth. You and your partner will be able to try a variety of labor positions with birth balls and rebozos as well as practice breathing, relaxation, and visualization techniques. A tour of the [facility] is included with this class. $20 per registrant and support person Childbirth Class- Weekend Option: This class is a Weekend version of our Birth & Baby series. It is designed for parents who have a difficult time fitting several weeks of classes into their schedule. It covers the care of your newborn and  the basics of labor and childbirth. It also includes a [place] [facility] and lunch. The class is held two consecutive days: beginning on Friday evening from 6:30 - 8:30 p.m. and the next day, Saturday from 9 a.m. - 4 p.m. (call for fee) Doren Custard Class: Interested in a waterbirth?  This informational class will help you discover whether waterbirth is the right fit for you. Education about waterbirth itself, supplies you would need and how to assemble your support team  is what you can expect from this class. Some obstetrical practices require this class in order to pursue a waterbirth. (Not all obstetrical practices offer waterbirth-check with your healthcare provider.) Register only the expectant mom, but you are encouraged to bring your partner to class! Required if planning waterbirth, no fee. Infant/Child CPR: Parents, grandparents, babysitters, and friends learn Cardio-Pulmonary Resuscitation skills for infants and children. You will also learn how to treat both conscious and unconscious choking in infants and children. This Family & Friends program does not offer certification. Register each participant individually to ensure that enough mannequins are available. (Call for fee) Grandparent Love: Expecting a grandbaby? This class is for you! Learn about the latest infant care and safety recommendations and ways to support your own child as he or she transitions into the parenting role. Taught by Registered Nurses who are childbirth instructors, but most importantly...they are grandmothers too! $10/person. Childbirth Class- Natural Childbirth: This series of 5 weekly classes is for expectant parents who want to learn and practice natural methods of coping with the process of labor and childbirth. Relaxation, breathing, massage, visualization, role of the partner, and helpful positioning are highlighted. Participants learn how to be confident in their body's ability to give birth. This class will empower and help parents make informed decisions about their own care. Includes discussion that will help new parents transition into the immediate postpartum period. [facility] is included. We suggest taking this class between 25-32 weeks, but it's only a recommendation. $75 per registrant and one support person or $30 Medicaid. Childbirth Class- 3 week Series: This option of 3 weekly classes helps you and your labor partner prepare for  childbirth. Newborn care, labor & birth, cesarean birth, pain management, and comfort techniques are discussed and a [place] [facility] is included. The class meets at the same time, on the same day of the week for 3 consecutive weeks beginning with the starting date you choose. $60 for registrant and one support person.  Marvelous Multiples: Expecting twins, triplets, or more? This class covers the differences in labor, birth, parenting, and breastfeeding issues that face multiples' parents. NICU tour is included. Led by a Certified Childbirth Educator who is the mother of twins. No fee. Caring for Baby: This class is for expectant and adoptive parents who want to learn and practice the most up-to-date newborn care for their babies. Focus is on birth through the first six weeks of life. Topics include feeding, bathing, diapering, crying, umbilical cord care, circumcision care and safe sleep. Parents learn to recognize symptoms of illness and when to call the pediatrician. Register only the mom-to-be and your partner or support person can plan to come with you! $10 per registrant and support person Childbirth Class- online option: This online class offers you the freedom to complete a Birth and Baby series in the comfort of your own home. The flexibility of this option allows you to review sections at your  own pace, at times convenient to you and your support people. It includes additional video information, animations, quizzes, and extended activities. Get organized with helpful eClass tools, checklists, and trackers. Once you register online for the class, you will receive an email within a few days to accept the invitation and begin the class when the time is right for you. The content will be available to you for 60 days. $60 for 60 days of online access for you and your support people.  Local Doulas: Natural Baby Doulas [email or web address] Tel:  [phone] [email or web address] [phone] [email or web address] The Labor Hassell Halim  (also do waterbirth tub rental) [phone] [email or web address] [phone] [email or web address] [employer] (PADA) [email or web address] [place] Birth [facility] Baby  [email or web address] Considering Waterbirth? Guide for patients at [facility] [employer]  Why consider waterbirth?  . Gentle birth for babies . Less pain medicine used in labor . May allow for passive descent/less pushing . May reduce perineal tears  . More mobility and instinctive maternal position changes . Increased maternal relaxation . Reduced blood pressure in labor  Is waterbirth safe? What are the risks of infection, drowning or other complications?  . Infection: o Very low risk (3.7 % for tub vs 4.8% for bed) o 7 in 8000 waterbirths with documented infection o Poorly cleaned equipment most common cause o Slightly lower group B strep transmission rate  . Drowning o Maternal:  - Very low risk   - Related to seizures or fainting o Newborn:  - Very low risk. No evidence of increased risk of respiratory problems in multiple large studies - Physiological protection from breathing under water - Avoid underwater birth if there are any fetal complications - Once baby's head is out of the water, keep it out.  . Birth complication o Some reports of cord trauma, but risk decreased by bringing baby to surface gradually o No evidence of increased risk of shoulder dystocia. Mothers can usually change positions faster in water than in a bed, possibly aiding the maneuvers to free the shoulder.   You must attend a Doren Custard class at [facility]  3rd Wednesday of every month from 7-9pm  [employer] by calling [phone] or online at [email or web address]  Bring [place] the certificate from the class to your prenatal appointment  Meet with a midwife at 36 weeks to see if you can still plan a waterbirth and to sign the consent.   Purchase or rent the following supplies:   Water Birth Pool (Birth Pool in a Box or [place] for instance)  (Tubs start ~$125)  Single-use disposable tub liner designed for your brand of tub  New garden hose labeled "lead-free", "suitable for drinking water",  Electric drain pump to remove water (We recommend 792 gallon per hour or greater pump.)   Separate garden hose to remove the dirty water  Fish net  Bathing suit top (optional)  Long-handled mirror (optional)  Places to purchase or rent supplies  GotWebTools.is for tub purchases and supplies  Waterbirthsolutions.com for tub purchases and supplies  The Labor Ladies ([email or web address]) $275 for tub rental/set-up & take down/kit   [employer] (http://www.[place].com/.htm) Information regarding doulas (labor support) who provide pool rentals  Our practice has a Birth Pool in a Box tub at the hospital that you may borrow on a first-come-first-served basis. It is your responsibility to to set up, clean and break down the tub. We cannot guarantee the  availability of this tub in advance. You are responsible for bringing all accessories listed above. If you do not have all necessary supplies you cannot have a waterbirth.    Things that would prevent you from having a waterbirth:  Premature, <37wks  Previous cesarean birth  Presence of thick meconium-stained fluid  Multiple gestation (Twins, triplets, etc.)  Uncontrolled diabetes or gestational diabetes requiring medication  Hypertension requiring medication or diagnosis of pre-eclampsia  Heavy vaginal bleeding  Non-reassuring fetal  heart rate  Active infection (MRSA, etc.). Group B Strep is NOT a contraindication for  waterbirth.  If your labor has to be induced and induction method requires continuous  monitoring of the baby's heart rate  Other risks/issues identified by your obstetrical provider  Please remember that birth is unpredictable. Under certain unforeseeable circumstances your provider may advise against giving birth in the tub. These decisions will be made on a case-by-case basis and with the safety of you and your baby as our highest priority.

## 2018-04-01 LAB — AFP, SERUM, OPEN SPINA BIFIDA
AFP MoM: 0.67
AFP VALUE AFPOSL: 20.7 ng/mL
Gest. Age on Collection Date: 18.2 weeks
MATERNAL AGE AT EDD: 23.9 a
OSBR Risk 1 IN: 10000
Test Results:: NEGATIVE
WEIGHT: 273 [lb_av]

## 2018-04-12 ENCOUNTER — Ambulatory Visit (HOSPITAL_COMMUNITY)
Admission: RE | Admit: 2018-04-12 | Discharge: 2018-04-12 | Disposition: A | Discharge status: Home / Self Care | Payer: Medicaid Other | Source: Ambulatory Visit | Attending: Obstetrics and Gynecology | Admitting: Obstetrics and Gynecology

## 2018-04-12 DIAGNOSIS — Z3A20 20 weeks gestation of pregnancy: Secondary | ICD-10-CM | POA: Diagnosis not present

## 2018-04-12 DIAGNOSIS — Z6791 Unspecified blood type, Rh negative: Secondary | ICD-10-CM | POA: Diagnosis not present

## 2018-04-12 DIAGNOSIS — O99212 Obesity complicating pregnancy, second trimester: Secondary | ICD-10-CM | POA: Insufficient documentation

## 2018-04-12 DIAGNOSIS — O26892 Other specified pregnancy related conditions, second trimester: Secondary | ICD-10-CM | POA: Diagnosis not present

## 2018-04-12 DIAGNOSIS — Z3402 Encounter for supervision of normal first pregnancy, second trimester: Secondary | ICD-10-CM

## 2018-04-12 DIAGNOSIS — Z363 Encounter for antenatal screening for malformations: Secondary | ICD-10-CM | POA: Diagnosis not present

## 2018-04-19 ENCOUNTER — Encounter (HOSPITAL_COMMUNITY): Payer: Self-pay | Admitting: *Deleted

## 2018-04-19 ENCOUNTER — Inpatient Hospital Stay (HOSPITAL_COMMUNITY)
Admission: AD | Admit: 2018-04-19 | Discharge: 2018-04-19 | Disposition: A | Discharge status: Home / Self Care | Payer: Medicaid Other | Source: Ambulatory Visit | Attending: Obstetrics and Gynecology | Admitting: Obstetrics and Gynecology

## 2018-04-19 ENCOUNTER — Other Ambulatory Visit: Payer: Self-pay

## 2018-04-19 DIAGNOSIS — R197 Diarrhea, unspecified: Secondary | ICD-10-CM

## 2018-04-19 DIAGNOSIS — K921 Melena: Secondary | ICD-10-CM | POA: Diagnosis present

## 2018-04-19 DIAGNOSIS — O26899 Other specified pregnancy related conditions, unspecified trimester: Secondary | ICD-10-CM

## 2018-04-19 DIAGNOSIS — O9989 Other specified diseases and conditions complicating pregnancy, childbirth and the puerperium: Secondary | ICD-10-CM | POA: Insufficient documentation

## 2018-04-19 DIAGNOSIS — Z3A21 21 weeks gestation of pregnancy: Secondary | ICD-10-CM | POA: Insufficient documentation

## 2018-04-19 DIAGNOSIS — O26892 Other specified pregnancy related conditions, second trimester: Secondary | ICD-10-CM

## 2018-04-19 LAB — URINALYSIS, ROUTINE W REFLEX MICROSCOPIC
Bilirubin Urine: NEGATIVE
Glucose, UA: NEGATIVE mg/dL
Hgb urine dipstick: NEGATIVE
KETONES UR: NEGATIVE mg/dL
LEUKOCYTES UA: NEGATIVE
NITRITE: NEGATIVE
PROTEIN: NEGATIVE mg/dL
Specific Gravity, Urine: 1.014 (ref 1.005–1.030)
pH: 7 (ref 5.0–8.0)

## 2018-04-19 NOTE — MAU Provider Note (Signed)
History     CSN: [id number]  Arrival date and time: 04/19/18 1755   First Provider Initiated Contact with Patient 04/19/18 1827      Chief Complaint  Patient presents with  . Blood In Stools   HPI Laura Estrada 21 y.o. [redacted]w[redacted]d  Comes to MAU with blood when vomiting and blood in diarrhea stool.  Was vomiting on Saturday and Sunday.  Was able to eat today and has not had vomiting.  Has had 3 watery stools today.  Last one at 2:30 pm.  Saw some drops of blood in the toilet after a BM and saw some blood on the tissue when wiping after the stool.  OB History    Gravida  2   Para      Term      Preterm      AB  1   Living        SAB  1   TAB      Ectopic      Multiple      Live Births              Past Medical History:  Diagnosis Date  . Back pain   . Complication of anesthesia    woke up during wisdom teeth extraction    Past Surgical History:  Procedure Laterality Date  . ANKLE SURGERY    . WISDOM TOOTH EXTRACTION      Family History  Problem Relation Age of Onset  . Hypertension Maternal Grandmother   . Hernia Paternal Grandmother        passed away d/t sepsis  . Parkinson's disease Paternal Grandmother   . Cancer Paternal Grandfather     Social History   Tobacco Use  . Smoking status: Former Games developer  . Smokeless tobacco: Never Used  Substance Use Topics  . Alcohol use: No    Frequency: Never  . Drug use: Yes    Types: Marijuana    Comment: LAST TIME ON 12-30-2017    Allergies:  Allergies  Allergen Reactions  . Penicillins Rash    Has patient had a PCN reaction causing immediate rash, facial/tongue/throat swelling, SOB or lightheadedness with hypotension: YES Has patient had a PCN reaction causing severe rash involving mucus membranes or skin necrosis: NO Has patient had a PCN reaction that required hospitalization: UNK Has patient had a PCN reaction occurring within the last 10 years: NO If all of the above answers are "NO", then may  proceed with Cephalosporin use.    Medications Prior to Admission  Medication Sig Dispense Refill Last Dose  . Prenatal Vit-Fe Fumarate-FA (PRENATAL COMPLETE) 14-0.4 MG TABS Take 1 tablet by mouth daily. 60 each 0 Taking  . promethazine (PHENERGAN) 25 MG tablet Take 0.5-1 tablets (12.5-25 mg total) by mouth every 6 (six) hours as needed. 30 tablet 2 Taking  . pyridOXINE (VITAMIN B-6) 50 MG tablet Take 1 tablet (50 mg total) by mouth 2 (two) times daily. 60 tablet 1 Taking    Review of Systems  Constitutional: Negative for fever.  Gastrointestinal: Positive for anal bleeding, diarrhea, nausea and vomiting.  Genitourinary: Negative for dysuria, vaginal bleeding and vaginal discharge.   Physical Exam   Blood pressure (!) 156/99, pulse 92, temperature 98.4 F (36.9 C), temperature source Oral, resp. rate 18, height  (1.702 m), weight 271 lb (122.9 kg), last menstrual period 11/11/2017, SpO2 98 %.  Physical Exam  Nursing note and vitals reviewed. Constitutional: She is oriented to person, place,  and time. She appears well-developed and well-nourished.  HENT:  Head: Normocephalic.  Eyes: EOM are normal.  Neck: Neck supple.  GI: Soft. There is no tenderness. There is no rebound and no guarding.  Genitourinary:  Genitourinary Comments: Visualization of the rectum - no hemorrhoids, no fissures, no blood seen.  Musculoskeletal: Normal range of motion.  Neurological: She is alert and oriented to person, place, and time.  Skin: Skin is warm and dry.  Psychiatric: She has a normal mood and affect.   Patient Vitals for the past 24 hrs:  BP Temp Temp src Pulse Resp SpO2 Height Weight  04/19/18 1901 (!) 119/59 - - 87 - - - -  04/19/18 1851 (!) 109/57 - - 89 16 - - -  04/19/18 1803 (!) 156/99 98.4 F (36.9 C) Oral 92 18 98 %  (1.702 m) 271 lb (122.9 kg)     MAU Course  Procedures Results for orders placed or performed during the hospital encounter of 04/19/18 (from the past 24  hour(s))  Urinalysis, Routine w reflex microscopic     Status: Abnormal   Collection Time: 04/19/18  5:59 PM  Result Value Ref Range   Color, Urine YELLOW YELLOW   APPearance HAZY (A) CLEAR   Specific Gravity, Urine 1.014 1.005 - 1.030   pH 7.0 5.0 - 8.0   Glucose, UA NEGATIVE NEGATIVE mg/dL   Hgb urine dipstick NEGATIVE NEGATIVE   Bilirubin Urine NEGATIVE NEGATIVE   Ketones, ur NEGATIVE NEGATIVE mg/dL   Protein, ur NEGATIVE NEGATIVE mg/dL   Nitrite NEGATIVE NEGATIVE   Leukocytes, UA NEGATIVE NEGATIVE    MDM LIkely client had a GI viral illness over the weekend with nausea, vomiting and diarrhea.  The nausea and vomiting has improved and she is able to eat without vomiting.  Diarrhea is now watery and seems to have slowed.  Advised to get imodium and take one dose to see if diarrhea is resolved.  Advised to not eat fried foods or oil which will prolong the diarrhea.  Assessment and Plan  Resolving GI illness Diarrhea with Blood in stool Pregnancy at [redacted]w[redacted]d Blood pressure  Plan Watch BP at future visits.  Has not had elevated BPs previously and rechecks were normal today. Get OTC Imodium and take one dose to stop the diarrhea. See AVS for additional info given to client.  [name] 04/19/2018, 6:42 PM

## 2018-04-19 NOTE — MAU Note (Signed)
Nausea and vomiting Saturday and Sunday, she noticed blood in her emesis.  Today she seen blood in her stool.  She had diarrhea x3 time today.

## 2018-04-19 NOTE — Discharge Instructions (Signed)
Get OTC Imodium and take one dose to stop the diarrhea. Let the clinic know if the blood in your stools is continuing during the pregnancy. Take Tylenol 325 mg 2 tablets by mouth every 4 hours if needed for pain. Drink at least 8 8-oz glasses of water every day.

## 2018-04-19 NOTE — MAU Note (Signed)
Pt reports she has had bloody diarrhea today, called the office and was told to come in. Denies pain.

## 2018-04-30 ENCOUNTER — Encounter: Payer: Self-pay | Admitting: Medical

## 2018-04-30 ENCOUNTER — Inpatient Hospital Stay (HOSPITAL_COMMUNITY)
Admission: AD | Admit: 2018-04-30 | Discharge: 2018-05-01 | Disposition: A | Discharge status: Home / Self Care | Payer: Medicaid Other | Source: Ambulatory Visit | Attending: Obstetrics & Gynecology | Admitting: Obstetrics & Gynecology

## 2018-04-30 DIAGNOSIS — O98812 Other maternal infectious and parasitic diseases complicating pregnancy, second trimester: Secondary | ICD-10-CM | POA: Insufficient documentation

## 2018-04-30 DIAGNOSIS — O26852 Spotting complicating pregnancy, second trimester: Secondary | ICD-10-CM | POA: Insufficient documentation

## 2018-04-30 DIAGNOSIS — Z87891 Personal history of nicotine dependence: Secondary | ICD-10-CM | POA: Insufficient documentation

## 2018-04-30 DIAGNOSIS — B373 Candidiasis of vulva and vagina: Secondary | ICD-10-CM | POA: Insufficient documentation

## 2018-04-30 DIAGNOSIS — B3731 Acute candidiasis of vulva and vagina: Secondary | ICD-10-CM

## 2018-04-30 DIAGNOSIS — Z3A22 22 weeks gestation of pregnancy: Secondary | ICD-10-CM | POA: Insufficient documentation

## 2018-04-30 DIAGNOSIS — Z880 Allergy status to penicillin: Secondary | ICD-10-CM | POA: Insufficient documentation

## 2018-04-30 HISTORY — DX: Unspecified asthma, uncomplicated: J45.909

## 2018-04-30 NOTE — MAU Note (Addendum)
This morning noticed chunky, white vag d/c. Messaged doctor and told ok not to come in and wait til Tues appt. Went to [facility] about ago and saw pink on tissue when I wiped. Poss yeast infection. Vag itching and burning

## 2018-05-01 ENCOUNTER — Other Ambulatory Visit: Payer: Self-pay

## 2018-05-01 ENCOUNTER — Encounter (HOSPITAL_COMMUNITY): Payer: Self-pay | Admitting: Emergency Medicine

## 2018-05-01 DIAGNOSIS — Z880 Allergy status to penicillin: Secondary | ICD-10-CM | POA: Diagnosis not present

## 2018-05-01 DIAGNOSIS — B373 Candidiasis of vulva and vagina: Secondary | ICD-10-CM

## 2018-05-01 DIAGNOSIS — O26852 Spotting complicating pregnancy, second trimester: Secondary | ICD-10-CM | POA: Diagnosis not present

## 2018-05-01 DIAGNOSIS — Z3A22 22 weeks gestation of pregnancy: Secondary | ICD-10-CM | POA: Diagnosis not present

## 2018-05-01 DIAGNOSIS — N898 Other specified noninflammatory disorders of vagina: Secondary | ICD-10-CM | POA: Diagnosis present

## 2018-05-01 DIAGNOSIS — O98812 Other maternal infectious and parasitic diseases complicating pregnancy, second trimester: Secondary | ICD-10-CM | POA: Diagnosis not present

## 2018-05-01 DIAGNOSIS — Z87891 Personal history of nicotine dependence: Secondary | ICD-10-CM | POA: Diagnosis not present

## 2018-05-01 LAB — URINALYSIS, ROUTINE W REFLEX MICROSCOPIC
Bilirubin Urine: NEGATIVE
Glucose, UA: NEGATIVE mg/dL
Ketones, ur: NEGATIVE mg/dL
Nitrite: NEGATIVE
Protein, ur: NEGATIVE mg/dL
Specific Gravity, Urine: 1.012 (ref 1.005–1.030)
pH: 6 (ref 5.0–8.0)

## 2018-05-01 LAB — WET PREP, GENITAL
Sperm: NONE SEEN
Trich, Wet Prep: NONE SEEN

## 2018-05-01 MED ORDER — TERCONAZOLE 0.4 % VA CREA
1.0000 | TOPICAL_CREAM | Freq: Every day | VAGINAL | 0 refills | Status: DC
Start: 2018-05-01 — End: 2018-07-07

## 2018-05-01 NOTE — MAU Provider Note (Signed)
Chief Complaint: Vaginitis and Vaginal Bleeding   First Provider Initiated Contact with Patient 05/01/18 0047      SUBJECTIVE HPI: Laura Estrada is a 22 y.o. G2P0010 at [redacted]w[redacted]d by LMP who presents to maternity admissions reporting thick white vaginal discharge, itching and burning, and pink spotting when wiping x 24 hours.  The itching and burning are worsening since onset this morning and she has seen spotting when wiping x 2-3 trips to the bathroom today.  Bleeding is light, pink, only seen when wiping.  There are no other associated symptoms. She has not tried any treatments. She was positive for chlamydia on 12/17/17 but TOC was negative at initial OB visit 1 month later.    HPI  Past Medical History:  Diagnosis Date  . Asthma   . Back pain   . Complication of anesthesia    woke up during wisdom teeth extraction   Past Surgical History:  Procedure Laterality Date  . ANKLE SURGERY    . WISDOM TOOTH EXTRACTION     Social History   Socioeconomic History  . Marital status: Single    Spouse name: Not on file  . Number of children: Not on file  . Years of education: Not on file  . Highest education level: Not on file  Occupational History  . Not on file  Social Needs  . Financial resource strain: Not on file  . Food insecurity:    Worry: Not on file    Inability: Not on file  . Transportation needs:    Medical: Not on file    Non-medical: Not on file  Tobacco Use  . Smoking status: Former Games developer  . Smokeless tobacco: Never Used  Substance and Sexual Activity  . Alcohol use: No    Frequency: Never  . Drug use: Yes    Types: Marijuana    Comment: LAST TIME ON 12-30-2017  . Sexual activity: Yes    Birth control/protection: None  Lifestyle  . Physical activity:    Days per week: Not on file    Minutes per session: Not on file  . Stress: Not on file  Relationships  . Social connections:    Talks on phone: Not on file    Gets together: Not on file    Attends  religious service: Not on file    Active member of club or organization: Not on file    Attends meetings of clubs or organizations: Not on file    Relationship status: Not on file  . Intimate partner violence:    Fear of current or ex partner: Not on file    Emotionally abused: Not on file    Physically abused: Not on file    Forced sexual activity: Not on file  Other Topics Concern  . Not on file  Social History Narrative  . Not on file   No current facility-administered medications on file prior to encounter.    Current Outpatient Medications on File Prior to Encounter  Medication Sig Dispense Refill  . Prenatal Vit-Fe Fumarate-FA (PRENATAL COMPLETE) 14-0.4 MG TABS Take 1 tablet by mouth daily. 60 each 0  . promethazine (PHENERGAN) 25 MG tablet Take 0.5-1 tablets (12.5-25 mg total) by mouth every 6 (six) hours as needed. 30 tablet 2  . pyridOXINE (VITAMIN B-6) 50 MG tablet Take 1 tablet (50 mg total) by mouth 2 (two) times daily. 60 tablet 1   Allergies  Allergen Reactions  . Penicillins Rash    Has patient had a  PCN reaction causing immediate rash, facial/tongue/throat swelling, SOB or lightheadedness with hypotension: YES Has patient had a PCN reaction causing severe rash involving mucus membranes or skin necrosis: NO Has patient had a PCN reaction that required hospitalization: UNK Has patient had a PCN reaction occurring within the last 10 years: NO If all of the above answers are "NO", then may proceed with Cephalosporin use.    ROS:  Review of Systems  Constitutional: Negative for chills, fatigue and fever.  Respiratory: Negative for shortness of breath.   Cardiovascular: Negative for chest pain.  Gastrointestinal: Negative for nausea and vomiting.  Genitourinary: Positive for vaginal bleeding and vaginal discharge. Negative for difficulty urinating, dysuria, flank pain, pelvic pain and vaginal pain.  Neurological: Negative for dizziness and headaches.   Psychiatric/Behavioral: Negative.      I have reviewed patient's Past Medical Hx, Surgical Hx, Family Hx, Social Hx, medications and allergies.   Physical Exam   Patient Vitals for the past 24 hrs:  BP Temp Temp src Pulse Resp Height Weight  05/01/18 0125 - 98.4 F (36.9 C) Oral (!) 101 18 - -  05/01/18 0011 115/69 - - - - - -  05/01/18 0009 - - - - 18 - -  04/30/18 2348 140/72 97.9 F (36.6 C) - (!) 109 (!) 118  (1.702 m) 274 lb (124.3 kg)   Constitutional: Well-developed, well-nourished female in no acute distress.  Cardiovascular: normal rate Respiratory: normal effort GI: Abd soft, non-tender. Pos BS x 4 MS: Extremities nontender, no edema, normal ROM Neurologic: Alert and oriented x 4.  GU: Neg CVAT.  PELVIC EXAM: Cervix pink, visually closed, without lesion,moderate amount thick white discharge clinging to cervix and vaginal walls, no bleeding noted  Cervix closed/thick/high  FHT 140 by doppler  LAB RESULTS Results for orders placed or performed during the hospital encounter of 04/30/18 (from the past 24 hour(s))  Urinalysis, Routine w reflex microscopic     Status: Abnormal   Collection Time: 05/01/18 12:16 AM  Result Value Ref Range   Color, Urine YELLOW YELLOW   APPearance CLEAR CLEAR   Specific Gravity, Urine 1.012 1.005 - 1.030   pH 6.0 5.0 - 8.0   Glucose, UA NEGATIVE NEGATIVE mg/dL   Hgb urine dipstick SMALL (A) NEGATIVE   Bilirubin Urine NEGATIVE NEGATIVE   Ketones, ur NEGATIVE NEGATIVE mg/dL   Protein, ur NEGATIVE NEGATIVE mg/dL   Nitrite NEGATIVE NEGATIVE   Leukocytes, UA SMALL (A) NEGATIVE   RBC / HPF 0-5 0 - 5 RBC/hpf   WBC, UA 6-10 0 - 5 WBC/hpf   Bacteria, UA RARE (A) NONE SEEN   Squamous Epithelial / LPF 0-5 0 - 5   Mucus PRESENT   Wet prep, genital     Status: Abnormal   Collection Time: 05/01/18  1:02 AM  Result Value Ref Range   Yeast Wet Prep HPF POC PRESENT (A) NONE SEEN   Trich, Wet Prep NONE SEEN NONE SEEN   Clue Cells Wet  Prep HPF POC PRESENT (A) NONE SEEN   WBC, Wet Prep HPF POC MODERATE (A) NONE SEEN   Sperm NONE SEEN     A/Negative/-- (02/26 1610)  IMAGING   MAU Management/MDM: Ordered labs and reviewed results.  Clinical exam and wet prep indicate vaginal candidiasis so will treat with Terazol 7 sent to pt pharmacy. No active bleeding noted on exam.  Cervix within normal limits, with no evidence of preterm labor.  Clue cells noted on wet prep but with  no other symptoms, does not meet criteria for treatment of BV.  Pt discharged with strict return precautions.  ASSESSMENT 1. Vaginal candidiasis   2. Spotting affecting pregnancy in second trimester     PLAN Discharge home Allergies as of 05/01/2018      Reactions   Penicillins Rash   Has patient had a PCN reaction causing immediate rash, facial/tongue/throat swelling, SOB or lightheadedness with hypotension: YES Has patient had a PCN reaction causing severe rash involving mucus membranes or skin necrosis: NO Has patient had a PCN reaction that required hospitalization: UNK Has patient had a PCN reaction occurring within the last 10 years: NO If all of the above answers are "NO", then may proceed with Cephalosporin use.      Medication List    TAKE these medications   PRENATAL COMPLETE 14-0.4 MG Tabs Take 1 tablet by mouth daily.   promethazine 25 MG tablet Commonly known as:  PHENERGAN Take 0.5-1 tablets (12.5-25 mg total) by mouth every 6 (six) hours as needed.   pyridOXINE 50 MG tablet Commonly known as:  VITAMIN B-6 Take 1 tablet (50 mg total) by mouth 2 (two) times daily.   terconazole 0.4 % vaginal cream Commonly known as:  TERAZOL 7 Place 1 applicator vaginally at bedtime.      Follow-up Information    [facility].   Specialty:  Obstetrics and Gynecology Why:  As scheduled, return to MAU as needed for emergencies. Contact information: [address] [place]  [address] [phone]          Sharen Counter Certified Nurse-Midwife 05/01/2018  1:28 AM

## 2018-05-04 ENCOUNTER — Ambulatory Visit (INDEPENDENT_AMBULATORY_CARE_PROVIDER_SITE_OTHER): Payer: Medicaid Other | Admitting: Obstetrics and Gynecology

## 2018-05-04 VITALS — BP 127/68 | HR 95 | Wt 266.0 lb

## 2018-05-04 DIAGNOSIS — R011 Cardiac murmur, unspecified: Secondary | ICD-10-CM

## 2018-05-04 DIAGNOSIS — Z6791 Unspecified blood type, Rh negative: Secondary | ICD-10-CM

## 2018-05-04 DIAGNOSIS — O26892 Other specified pregnancy related conditions, second trimester: Secondary | ICD-10-CM

## 2018-05-04 DIAGNOSIS — Z3400 Encounter for supervision of normal first pregnancy, unspecified trimester: Secondary | ICD-10-CM

## 2018-05-04 LAB — GC/CHLAMYDIA PROBE AMP (~~LOC~~) NOT AT ARMC
Chlamydia: NEGATIVE
Neisseria Gonorrhea: NEGATIVE

## 2018-05-04 NOTE — Progress Notes (Signed)
   PRENATAL VISIT NOTE  Subjective:  Laura Estrada is a 22 y.o. G2P0010 at [redacted]w[redacted]d being seen today for ongoing prenatal care.  She is currently monitored for the following issues for this low-risk pregnancy and has Chlamydia infection affecting pregnancy; Supervision of low-risk first pregnancy; and Rh negative state in antepartum period, second trimester on their problem list.  Patient reports no complaints.  Contractions: Not present. Vag. Bleeding: None.  Movement: Present. Denies leaking of fluid.   The following portions of the patient's history were reviewed and updated as appropriate: allergies, current medications, past family history, past medical history, past social history, past surgical history and problem list. Problem list updated.  Objective:   Vitals:   05/04/18 1126  BP: 127/68  Pulse: 95  Weight: 266 lb (120.7 kg)    Fetal Status: Fetal Heart Rate (bpm): 150 Fundal Height: 24 cm Movement: Present     General:  Alert, oriented and cooperative. Patient is in no acute distress.  Skin: Skin is warm and dry. No rash noted.   Cardiovascular: Normal heart rate noted  Respiratory: Normal respiratory effort, no problems with respiration noted  Abdomen: Soft, gravid, appropriate for gestational age.  Pain/Pressure: Absent     Pelvic: Cervical exam deferred        Extremities: Normal range of motion.  Edema: None  Mental Status: Normal mood and affect. Normal behavior. Normal judgment and thought content.   Assessment and Plan:  Pregnancy: G2P0010 at [redacted]w[redacted]d   1. Rh negative state in antepartum period, second trimester  Rhogam at 28 weeks   2. Encounter for supervision of low-risk first pregnancy, antepartum  - [place] MFM OB FOLLOW UP; Future, limited views    There are no diagnoses linked to this encounter. Preterm labor symptoms and general obstetric precautions including but not limited to vaginal bleeding, contractions, leaking of fluid and fetal movement were  reviewed in detail with the patient. Please refer to After Visit Summary for other counseling recommendations.  Return in about 1 month (around 06/01/2018) for come fasting for GTT test. .  Future Appointments  Date Time Provider Department Center  05/06/2018 11:15 AM [facility] [place] 4 [facility]-M[facility] MFC-US  06/01/2018  8:20 AM [facility] LAB [facility]OC  06/01/2018  9:15 AM Burleson, Brand Males, NP [facility]OC    Venia Carbon, NP

## 2018-05-04 NOTE — Patient Instructions (Addendum)
DTaP Vaccine (Diphtheria, Tetanus, and Pertussis): What You Need to Know 1. Why get vaccinated? Diphtheria, tetanus, and pertussis are serious diseases caused by bacteria. Diphtheria and pertussis are spread from person to person. Tetanus enters the body through cuts or wounds. DIPHTHERIA causes a thick covering in the back of the throat.  It can lead to breathing problems, paralysis, heart failure, and even death.  TETANUS (Lockjaw) causes painful tightening of the muscles, usually all over the body.  It can lead to "locking" of the jaw so the victim cannot open his mouth or swallow. Tetanus leads to death in up to 2 out of 10 cases.  PERTUSSIS (Whooping Cough) causes coughing spells so bad that it is hard for infants to eat, drink, or breathe. These spells can last for weeks.  It can lead to pneumonia, seizures (jerking and staring spells), brain damage, and death.  Diphtheria, tetanus, and pertussis vaccine (DTaP) can help prevent these diseases. Most children who are vaccinated with DTaP will be protected throughout childhood. Many more children would get these diseases if we stopped vaccinating. DTaP is a safer version of an older vaccine called DTP. DTP is no longer used in the [place]. 2. Who should get DTaP vaccine and when? Children should get 5 doses of DTaP vaccine, one dose at each of the following ages:  2 months  4 months  6 months  15-18 months  4-6 years  DTaP may be given at the same time as other vaccines. 3. Some children should not get DTaP vaccine or should wait  Children with minor illnesses, such as a cold, may be vaccinated. But children who are moderately or severely ill should usually wait until they recover before getting DTaP vaccine.  Any child who had a life-threatening allergic reaction after a dose of DTaP should not get another dose.  Any child who suffered a brain or nervous system disease within 7 days after a dose of DTaP should not get  another dose.  Talk with your doctor if your child: ? had a seizure or collapsed after a dose of DTaP, ? cried non-stop for 3 hours or more after a dose of DTaP, ? had a fever over 105F after a dose of DTaP. Ask your doctor for more information. Some of these children should not get another dose of pertussis vaccine, but may get a vaccine without pertussis, called DT. 4. Older children and adults DTaP is not licensed for adolescents, adults, or children 83 years of age and older. But older people still need protection. A vaccine called Tdap is similar to DTaP. A single dose of Tdap is recommended for people 11 through 24 years of age. Another vaccine, called Td, protects against tetanus and diphtheria, but not pertussis. It is recommended every 10 years. There are separate Vaccine Information Statements for these vaccines. 5. What are the risks from DTaP vaccine? Getting diphtheria, tetanus, or pertussis disease is much riskier than getting DTaP vaccine. However, a vaccine, like any medicine, is capable of causing serious problems, such as severe allergic reactions. The risk of DTaP vaccine causing serious harm, or death, is extremely small. Mild problems (common)  Fever (up to about 1 child in 4)  Redness or swelling where the shot was given (up to about 1 child in 4)  Soreness or tenderness where the shot was given (up to about 1 child in 4) These problems occur more often after the 4th and 5th doses of the DTaP series than after  earlier doses. Sometimes the 4th or 5th dose of DTaP vaccine is followed by swelling of the entire arm or leg in which the shot was given, lasting 1-7 days (up to about 1 child in 30). Other mild problems include:  Fussiness (up to about 1 child in 3)  Tiredness or poor appetite (up to about 1 child in 10)  Vomiting (up to about 1 child in 50) These problems generally occur 1-3 days after the shot. Moderate problems (uncommon)  Seizure (jerking or staring)  (about 1 child out of 14,000)  Non-stop crying, for 3 hours or more (up to about 1 child out of 1,000)  High fever, over 105F (about 1 child out of 16,000) Severe problems (very rare)  Serious allergic reaction (less than 1 out of a million doses)  Several other severe problems have been reported after DTaP vaccine. These include: ? Long-term seizures, coma, or lowered consciousness ? Permanent brain damage. These are so rare it is hard to tell if they are caused by the vaccine. Controlling fever is especially important for children who have had seizures, for any reason. It is also important if another family member has had seizures. You can reduce fever and pain by giving your child an aspirin-free pain reliever when the shot is given, and for the next 24 hours, following the package instructions. 6. What if there is a serious reaction? What should I look for? Look for anything that concerns you, such as signs of a severe allergic reaction, very high fever, or behavior changes. Signs of a severe allergic reaction can include hives, swelling of the face and throat, difficulty breathing, a fast heartbeat, dizziness, and weakness. These would start a few minutes to a few hours after the vaccination. What should I do?  If you think it is a severe allergic reaction or other emergency that can't wait, call 9-1-1 or get the person to the nearest hospital. Otherwise, call your doctor.  Afterward, the reaction should be reported to the Vaccine Adverse Event Reporting System (VAERS). Your doctor might file this report, or you can do it yourself through the VAERS web site at www.vaers.LAgents.no, or by calling 1-559-097-7504. ? VAERS is only for reporting reactions. They do not give medical advice. 7. The National Vaccine Injury Compensation Program The [employer] Vaccine Injury Compensation Program (VICP) is a federal program that was created to compensate people who may have been injured by certain  vaccines. Persons who believe they may have been injured by a vaccine can learn about the program and about filing a claim by calling 1-438-084-8149 or visiting the VICP website at SpiritualWord.at. 8. How can I learn more?  Ask your doctor.  Call your local or state health department.  Contact the Centers for Disease Control and Prevention (CDC): ? Call 780-846-2977 (1-800-CDC-INFO) or ? Visit CDC's website at PicCapture.uy CDC DTaP Vaccine (Diphtheria, Tetanus, and Pertussis) VIS (04/23/06) This information is not intended to replace advice given to you by your health care provider. Make sure you discuss any questions you have with your health care provider. Document Released: 09/21/2006 Document Revised: 08/14/2016 Document Reviewed: 08/14/2016 Elsevier Interactive Patient Education  2017 [employer].    Deciding about Circumcision in [place] Boys  (The Basics)  What is circumcision?  Circumcision is a surgery that removes the skin that covers the tip of the penis, called the "foreskin" Circumcision is usually done when a boy is between 29 and 52 days old. In the [place], circumcision is common.  In some other countries, fewer boys are circumcised. Circumcision is a common tradition in some religions.  Should I have my baby boy circumcised?  There is no easy answer. Circumcision has some benefits. But it also has risks. After talking with your doctor, you will have to decide for yourself what is right for your family.  What are the benefits of circumcision?  Circumcised boys seem to have slightly lower rates of: ?Urinary tract infections ?Swelling of the opening at the tip of the penis Circumcised men seem to have slightly lower rates of: ?Urinary tract infections ?Swelling of the opening at the tip of the penis ?Penis cancer ?HIV and other infections that you catch during sex ?Cervical cancer in the women they have sex with Even so, in the  [place], the risks of these problems are small - even in boys and men who have not been circumcised. Plus, boys and men who are not circumcised can reduce these extra risks by: ?Cleaning their penis well ?Using condoms during sex  What are the risks of circumcision?  Risks include: ?Bleeding or infection from the surgery ?Damage to or amputation of the penis ?A chance that the doctor will cut off too much or not enough of the foreskin ?A chance that sex won't feel as good later in life Only about 1 out of every 200 circumcisions leads to problems. There is also a chance that your health insurance won't pay for circumcision.  How is circumcision done in baby boys?  First, the baby gets medicine for pain relief. This might be a cream on the skin or a shot into the base of the penis. Next, the doctor cleans the baby's penis well. Then he or she uses special tools to cut off the foreskin. Finally, the doctor wraps a bandage (called gauze) around the baby's penis. If you have your baby circumcised, his doctor or nurse will give you instructions on how to care for him after the surgery. It is important that you follow those instructions carefully.   CIRCUMCISION  Circumcision is considered an elective/non-medically necessary procedure. There are many reasons parents decide to have their sons circumsized. During the first year of life circumcised males have a reduced risk of urinary tract infections but after this year the rates between circumcised males and uncircumcised males are the same.  It is safe to have your son circumcised outside of the hospital and the places above perform them regularly.    Places to have your son circumcised:    [facility] [phone] $480 by 4 wks  [facility] [phone] $244 by 4 wks  [facility] [phone] $175 by 2 wks   [facility] [phone] $250 by 7 days [facility] [phone] $150 by 4 wks  These prices sometimes change but are roughly what you can expect to pay. Please call and confirm pricing.    Contraception Choices Contraception, also called birth control, means things to use or ways to try not to get pregnant. Hormonal birth control This kind of birth control uses hormones. Here are some types of hormonal birth control:  A tube that is put under skin of the arm (implant). The tube can stay in for as long as 3 years.  Shots to get every 3 months (injections).  Pills to take every day (birth control pills).  A patch to change 1 time each week for 3 weeks (birth control patch). After that, the patch is taken off for 1 week.  A ring to put in the  vagina. The ring is left in for 3 weeks. Then it is taken out of the vagina for 1 week. Then a new ring is put in.  Pills to take after unprotected sex (emergency birth control pills).  Barrier birth control Here are some types of barrier birth control:  A thin covering that is put on the penis before sex (female condom). The covering is thrown away after sex.  A soft, loose covering that is put in the vagina before sex (female condom). The covering is thrown away after sex.  A rubber bowl that sits over the cervix (diaphragm). The bowl must be made for you. The bowl is put into the vagina before sex. The bowl is left in for 6-8 hours after sex. It is taken out within 24 hours.  A small, soft cup that fits over the cervix (cervical cap). The cup must be made for you. The cup can be left in for 6-8 hours after sex. It is taken out within 48 hours.  A sponge that is put into the vagina before sex. It must be left in for at least 6 hours after sex. It must be taken out within 30 hours. Then it is thrown away.  A chemical that kills or stops sperm from getting into the uterus (spermicide). It may be a pill, cream, jelly,  or foam to put in the vagina. The chemical should be used at least 10-15 minutes before sex.  IUD (intrauterine) birth control An IUD is a small, T-shaped piece of plastic. It is put inside the uterus. There are two kinds:  Hormone IUD. This kind can stay in for 3-5 years.  Copper IUD. This kind can stay in for 10 years.  Permanent birth control Here are some types of permanent birth control:  Surgery to block the fallopian tubes.  Having an insert put into each fallopian tube.  Surgery to tie off the tubes that carry sperm (vasectomy).  Natural planning birth control Here are some types of natural planning birth control:  Not having sex on the days the woman could get pregnant.  Using a calendar: ? To keep track of the length of each period. ? To find out what days pregnancy can happen. ? To plan to not have sex on days when pregnancy can happen.  Watching for symptoms of ovulation and not having sex during ovulation. One way the woman can check for ovulation is to check her temperature.  Waiting to have sex until after ovulation.  Summary  Contraception, also called birth control, means things to use or ways to try not to get pregnant.  Hormonal methods of birth control include implants, injections, pills, patches, vaginal rings, and emergency birth control pills.  Barrier methods of birth control can include female condoms, female condoms, diaphragms, cervical caps, sponges, and spermicides.  There are two types of IUD (intrauterine device) birth control. An IUD can be put in a woman's uterus to prevent pregnancy for 3-5 years.  Permanent sterilization can be done through a procedure for males, females, or both.  Natural planning methods involve not having sex on the days when the woman could get pregnant. This information is not intended to replace advice given to you by your health care provider. Make sure you discuss any questions you have with your health care  provider. Document Released: 09/21/2009 Document Revised: 12/04/2016 Document Reviewed: 12/04/2016 Elsevier Interactive Patient Education  2017 Elsevier [employer].   [facility] [facility] PEDIAT[facility]/FAMILY PRACTICE PHYSICIANS  [facility] [address] [place], [place]  [address] Phone - [phone]   Fax - [phone]  [facility] [address] [place], [place] [address] Phone - [phone]   Fax - [phone]  [facility] AMOS [address] [place], [place]  [address] Phone - [phone]   Fax - [phone]  [facility] [address] [place], [place]  [address] Phone - [phone]   Fax - [phone]  [facility] [address] [place], [place]  [address] Phone - [phone]   Fax - [phone]  [facility] [address] [place], [place]  [address] Phone - [phone]   Fax - [phone]  [facility] [place] [address] [place], [place]  [address] Phone - [phone]   Fax - [phone]  [facility] AT [facility] [address] [place], [address] [place], [place]  [address] Phone - [phone]   Fax - [phone]  [facility] AT [facility] [address] [place], [place]  [address] Phone - [phone]   Fax - [phone] [facility] AT [place] [address] [place], [place]  [address] Phone - [phone]   Fax - [phone]  [facility] [address] [place], [place]  [address] Phone - [phone]   Fax - [phone]  [facility] [address], [place]  [address] Phone - [phone]   Fax - [phone]  [facility] [place] [address] [place], [place]  [address] Phone - [phone]   Fax - [phone]  [facility] [address] [place], [place]  [address] Phone -  [phone]  [facility] [address] [place], [place]  [address] Phone - [phone]   Fax - [phone]  [facility] [address] [place], [place]  [address] Phone - [phone]   Fax - [phone]  [facility] [address] [place], [place]  [address] Phone - [phone]   Fax - [phone]  [facility] [address] [place], [place]  [address] Phone - [phone]   Fax - [phone]   [facility] [address] [place], [place]  [address] Phone - [phone]   Fax - [phone]  [facility] [address] [place], [place]  [address] Phone - [phone]   Fax - [phone]  DAVID RUBIN [address] [place], [place]  [address] Phone - [phone]   Fax - [phone]  [facility] [address] [place], [place]  [address] Phone - [phone]   Fax - [phone]  [facility] [address] [place], [place]  [address] Phone - [phone]   Fax - [phone] Gerarda Fraction [address] [place], [place]  [address] Phone - [phone]   Fax - [phone]  [facility] [address] [place], [place]  [address] Phone - [phone]   Fax - [phone]  [facility] [place] [address] [place], [place]  [address] Phone - [phone]   Fax - [phone]  [facility] [place] Wyvonne Lenz MD [address] [place] [address] Phone [phone]  Fax [phone]

## 2018-05-06 ENCOUNTER — Other Ambulatory Visit: Payer: Self-pay | Admitting: Obstetrics and Gynecology

## 2018-05-06 ENCOUNTER — Ambulatory Visit (HOSPITAL_COMMUNITY)
Admission: RE | Admit: 2018-05-06 | Discharge: 2018-05-06 | Disposition: A | Discharge status: Home / Self Care | Payer: Medicaid Other | Source: Ambulatory Visit | Attending: Obstetrics and Gynecology | Admitting: Obstetrics and Gynecology

## 2018-05-06 DIAGNOSIS — IMO0002 Reserved for concepts with insufficient information to code with codable children: Secondary | ICD-10-CM

## 2018-05-06 DIAGNOSIS — R011 Cardiac murmur, unspecified: Secondary | ICD-10-CM | POA: Insufficient documentation

## 2018-05-06 DIAGNOSIS — O321XX0 Maternal care for breech presentation, not applicable or unspecified: Secondary | ICD-10-CM | POA: Diagnosis not present

## 2018-05-06 DIAGNOSIS — O99212 Obesity complicating pregnancy, second trimester: Secondary | ICD-10-CM | POA: Insufficient documentation

## 2018-05-06 DIAGNOSIS — Z362 Encounter for other antenatal screening follow-up: Secondary | ICD-10-CM | POA: Diagnosis present

## 2018-05-06 DIAGNOSIS — E669 Obesity, unspecified: Secondary | ICD-10-CM | POA: Diagnosis not present

## 2018-05-06 DIAGNOSIS — Z0489 Encounter for examination and observation for other specified reasons: Secondary | ICD-10-CM | POA: Diagnosis not present

## 2018-05-06 DIAGNOSIS — Z3A34 34 weeks gestation of pregnancy: Secondary | ICD-10-CM | POA: Diagnosis present

## 2018-05-06 DIAGNOSIS — Z3A24 24 weeks gestation of pregnancy: Secondary | ICD-10-CM

## 2018-05-06 DIAGNOSIS — Z3A23 23 weeks gestation of pregnancy: Secondary | ICD-10-CM | POA: Insufficient documentation

## 2018-05-06 DIAGNOSIS — O360190 Maternal care for anti-D [Rh] antibodies, unspecified trimester, not applicable or unspecified: Secondary | ICD-10-CM | POA: Diagnosis not present

## 2018-05-28 ENCOUNTER — Other Ambulatory Visit: Payer: Self-pay | Admitting: *Deleted

## 2018-05-28 DIAGNOSIS — Z6791 Unspecified blood type, Rh negative: Secondary | ICD-10-CM

## 2018-05-28 DIAGNOSIS — O26892 Other specified pregnancy related conditions, second trimester: Secondary | ICD-10-CM

## 2018-05-28 DIAGNOSIS — Z3400 Encounter for supervision of normal first pregnancy, unspecified trimester: Secondary | ICD-10-CM

## 2018-06-01 ENCOUNTER — Other Ambulatory Visit: Payer: Self-pay | Admitting: Nurse Practitioner

## 2018-06-01 ENCOUNTER — Other Ambulatory Visit: Payer: Medicaid Other

## 2018-06-01 ENCOUNTER — Inpatient Hospital Stay (HOSPITAL_COMMUNITY)
Admission: AD | Admit: 2018-06-01 | Discharge: 2018-06-01 | Disposition: A | Discharge status: Home / Self Care | Payer: Medicaid Other | Source: Ambulatory Visit | Attending: Family Medicine | Admitting: Family Medicine

## 2018-06-01 ENCOUNTER — Encounter (HOSPITAL_COMMUNITY): Payer: Self-pay

## 2018-06-01 ENCOUNTER — Ambulatory Visit (INDEPENDENT_AMBULATORY_CARE_PROVIDER_SITE_OTHER): Payer: Medicaid Other | Admitting: Nurse Practitioner

## 2018-06-01 ENCOUNTER — Other Ambulatory Visit: Payer: Self-pay

## 2018-06-01 VITALS — BP 135/70 | HR 96 | Wt 269.8 lb

## 2018-06-01 DIAGNOSIS — O26892 Other specified pregnancy related conditions, second trimester: Secondary | ICD-10-CM

## 2018-06-01 DIAGNOSIS — Z820 Family history of epilepsy and other diseases of the nervous system: Secondary | ICD-10-CM | POA: Insufficient documentation

## 2018-06-01 DIAGNOSIS — O09892 Supervision of other high risk pregnancies, second trimester: Secondary | ICD-10-CM | POA: Insufficient documentation

## 2018-06-01 DIAGNOSIS — Z8249 Family history of ischemic heart disease and other diseases of the circulatory system: Secondary | ICD-10-CM | POA: Diagnosis not present

## 2018-06-01 DIAGNOSIS — Z79899 Other long term (current) drug therapy: Secondary | ICD-10-CM | POA: Insufficient documentation

## 2018-06-01 DIAGNOSIS — Z3A26 26 weeks gestation of pregnancy: Secondary | ICD-10-CM | POA: Diagnosis not present

## 2018-06-01 DIAGNOSIS — Z3402 Encounter for supervision of normal first pregnancy, second trimester: Secondary | ICD-10-CM

## 2018-06-01 DIAGNOSIS — R109 Unspecified abdominal pain: Secondary | ICD-10-CM | POA: Diagnosis not present

## 2018-06-01 DIAGNOSIS — O139 Gestational [pregnancy-induced] hypertension without significant proteinuria, unspecified trimester: Secondary | ICD-10-CM

## 2018-06-01 DIAGNOSIS — Z3A27 27 weeks gestation of pregnancy: Secondary | ICD-10-CM | POA: Insufficient documentation

## 2018-06-01 DIAGNOSIS — Z880 Allergy status to penicillin: Secondary | ICD-10-CM | POA: Diagnosis not present

## 2018-06-01 DIAGNOSIS — O24419 Gestational diabetes mellitus in pregnancy, unspecified control: Secondary | ICD-10-CM

## 2018-06-01 DIAGNOSIS — O360920 Maternal care for other rhesus isoimmunization, second trimester, not applicable or unspecified: Secondary | ICD-10-CM

## 2018-06-01 DIAGNOSIS — Z23 Encounter for immunization: Secondary | ICD-10-CM | POA: Diagnosis not present

## 2018-06-01 DIAGNOSIS — N760 Acute vaginitis: Secondary | ICD-10-CM | POA: Insufficient documentation

## 2018-06-01 DIAGNOSIS — Z6841 Body Mass Index (BMI) 40.0 and over, adult: Secondary | ICD-10-CM

## 2018-06-01 DIAGNOSIS — Z87891 Personal history of nicotine dependence: Secondary | ICD-10-CM | POA: Insufficient documentation

## 2018-06-01 DIAGNOSIS — Z6791 Unspecified blood type, Rh negative: Secondary | ICD-10-CM

## 2018-06-01 DIAGNOSIS — Z3400 Encounter for supervision of normal first pregnancy, unspecified trimester: Secondary | ICD-10-CM

## 2018-06-01 DIAGNOSIS — B9689 Other specified bacterial agents as the cause of diseases classified elsewhere: Secondary | ICD-10-CM

## 2018-06-01 DIAGNOSIS — N949 Unspecified condition associated with female genital organs and menstrual cycle: Secondary | ICD-10-CM

## 2018-06-01 DIAGNOSIS — R197 Diarrhea, unspecified: Secondary | ICD-10-CM | POA: Diagnosis not present

## 2018-06-01 LAB — WET PREP, GENITAL
Sperm: NONE SEEN
TRICH WET PREP: NONE SEEN
YEAST WET PREP: NONE SEEN

## 2018-06-01 LAB — PROTEIN / CREATININE RATIO, URINE
CREATININE, URINE: 181 mg/dL
PROTEIN CREATININE RATIO: 0.11 mg/mg{creat} (ref 0.00–0.15)
TOTAL PROTEIN, URINE: 20 mg/dL

## 2018-06-01 LAB — COMPREHENSIVE METABOLIC PANEL
ALT: 35 U/L (ref 0–44)
ANION GAP: 9 (ref 5–15)
AST: 18 U/L (ref 15–41)
Albumin: 3.1 g/dL — ABNORMAL LOW (ref 3.5–5.0)
Alkaline Phosphatase: 73 U/L (ref 38–126)
BUN: 5 mg/dL — AB (ref 6–20)
CHLORIDE: 106 mmol/L (ref 98–111)
CO2: 20 mmol/L — ABNORMAL LOW (ref 22–32)
Calcium: 8.8 mg/dL — ABNORMAL LOW (ref 8.9–10.3)
Creatinine, Ser: 0.5 mg/dL (ref 0.44–1.00)
Glucose, Bld: 96 mg/dL (ref 70–99)
POTASSIUM: 3.4 mmol/L — AB (ref 3.5–5.1)
Sodium: 135 mmol/L (ref 135–145)
Total Bilirubin: 0.5 mg/dL (ref 0.3–1.2)
Total Protein: 6.9 g/dL (ref 6.5–8.1)

## 2018-06-01 LAB — POCT URINALYSIS DIP (DEVICE)
BILIRUBIN URINE: NEGATIVE
Glucose, UA: 100 mg/dL — AB
KETONES UR: NEGATIVE mg/dL
Leukocytes, UA: NEGATIVE
Nitrite: NEGATIVE
PH: 7 (ref 5.0–8.0)
Protein, ur: NEGATIVE mg/dL
SPECIFIC GRAVITY, URINE: 1.025 (ref 1.005–1.030)
Urobilinogen, UA: 0.2 mg/dL (ref 0.0–1.0)

## 2018-06-01 LAB — CBC
HCT: 31.6 % — ABNORMAL LOW (ref 36.0–46.0)
Hemoglobin: 10.5 g/dL — ABNORMAL LOW (ref 12.0–15.0)
MCH: 26.8 pg (ref 26.0–34.0)
MCHC: 33.2 g/dL (ref 30.0–36.0)
MCV: 80.6 fL (ref 78.0–100.0)
PLATELETS: 259 10*3/uL (ref 150–400)
RBC: 3.92 MIL/uL (ref 3.87–5.11)
RDW: 15.3 % (ref 11.5–15.5)
WBC: 15.3 10*3/uL — AB (ref 4.0–10.5)

## 2018-06-01 LAB — URINALYSIS, ROUTINE W REFLEX MICROSCOPIC
BACTERIA UA: NONE SEEN
Bilirubin Urine: NEGATIVE
Glucose, UA: NEGATIVE mg/dL
KETONES UR: NEGATIVE mg/dL
Nitrite: NEGATIVE
PH: 7 (ref 5.0–8.0)
Protein, ur: NEGATIVE mg/dL
SPECIFIC GRAVITY, URINE: 1.017 (ref 1.005–1.030)

## 2018-06-01 MED ORDER — RHO D IMMUNE GLOBULIN 1500 UNIT/2ML IJ SOSY
300.0000 ug | PREFILLED_SYRINGE | Freq: Once | INTRAMUSCULAR | Status: AC
Start: 2018-06-01 — End: 2018-06-01
  Administered 2018-06-01: 300 ug via INTRAMUSCULAR

## 2018-06-01 MED ORDER — METRONIDAZOLE 500 MG PO TABS: 500.0000 mg | ORAL_TABLET | Freq: Two times a day (BID) | ORAL | 0 refills | Status: AC

## 2018-06-01 NOTE — MAU Provider Note (Addendum)
Patient Laura PalauBrooke Estrada is a 22 y.o. G2P0010 At 241w2d here with complaints of contractions and diarrhea. She denies HA, vaginal bleeding, LUQ pain, decreased fetal movements, abnormal discharge, blurry vision or LOF. She has had increasing blood pressures at her prenatal visits; baseline pre-e labs were drawn today. She is here because she started feeling like she was having contractions at about 2:30 pm this afternoon. SHe has been having diarrhea for the past four days as well.  History     CSN: [id number]  Arrival date and time: 06/01/18 1832   None     Chief Complaint  Patient presents with  . Abdominal Pain  . Vaginal Discharge   Diarrhea   This is a new problem. The current episode started in the past 7 days. The problem occurs 2 to 4 times per day. The problem has been gradually improving. The stool consistency is described as watery. Associated symptoms include abdominal pain. Pertinent negatives include no chills, coughing, URI or vomiting. Nothing aggravates the symptoms. There are no known risk factors.  Abdominal Pain  This is a new problem. The current episode started today. The onset quality is sudden. The problem occurs constantly. The problem has been resolved. The pain is located in the periumbilical region, LLQ and RLQ. The pain is at a severity of 10/10. The quality of the pain is cramping. Associated symptoms include diarrhea. Pertinent negatives include no dysuria or vomiting. Nothing aggravates the pain. The pain is relieved by nothing.    OB History    Gravida  2   Para      Term      Preterm      AB  1   Living        SAB  1   TAB      Ectopic      Multiple      Live Births              Past Medical History:  Diagnosis Date  . Asthma   . Back pain   . Complication of anesthesia    woke up during wisdom teeth extraction    Past Surgical History:  Procedure Laterality Date  . ANKLE SURGERY    . WISDOM TOOTH EXTRACTION      Family  History  Problem Relation Age of Onset  . Hypertension Maternal Grandmother   . Hernia Paternal Grandmother        passed away d/t sepsis  . Parkinson's disease Paternal Grandmother   . Cancer Paternal Grandfather     Social History   Tobacco Use  . Smoking status: Former Games developermoker  . Smokeless tobacco: Never Used  Substance Use Topics  . Alcohol use: No    Frequency: Never  . Drug use: Yes    Types: Marijuana    Comment: LAST TIME ON 12-30-2017    Allergies:  Allergies  Allergen Reactions  . Penicillins Rash    Has patient had a PCN reaction causing immediate rash, facial/tongue/throat swelling, SOB or lightheadedness with hypotension: YES Has patient had a PCN reaction causing severe rash involving mucus membranes or skin necrosis: NO Has patient had a PCN reaction that required hospitalization: UNK Has patient had a PCN reaction occurring within the last 10 years: NO If all of the above answers are "NO", then may proceed with Cephalosporin use.    Medications Prior to Admission  Medication Sig Dispense Refill Last Dose  . Prenatal Vit-Fe Fumarate-FA (PRENATAL COMPLETE) 14-0.4 MG TABS Take  1 tablet by mouth daily. 60 each 0 Taking  . promethazine (PHENERGAN) 25 MG tablet Take 0.5-1 tablets (12.5-25 mg total) by mouth every 6 (six) hours as needed. 30 tablet 2 Taking  . pyridOXINE (VITAMIN B-6) 50 MG tablet Take 1 tablet (50 mg total) by mouth 2 (two) times daily. 60 tablet 1 Taking  . terconazole (TERAZOL 7) 0.4 % vaginal cream Place 1 applicator vaginally at bedtime. 45 g 0 Taking    Review of Systems  Constitutional: Negative for chills.  Respiratory: Negative for cough.   Gastrointestinal: Positive for abdominal pain and diarrhea. Negative for vomiting.  Genitourinary: Negative for dysuria.  Musculoskeletal: Negative.   Neurological: Negative.   Psychiatric/Behavioral: Negative.    Physical Exam   Blood pressure 124/64, pulse 99, temperature 98.3 F (36.8 C),  temperature source Oral, resp. rate 20, weight 271 lb 12 oz (123.3 kg), last menstrual period 11/11/2017, SpO2 98 %.  Physical Exam  Constitutional: She appears well-developed.  HENT:  Head: Normocephalic.  Neck: Normal range of motion.  GI: Soft.  Genitourinary:  Genitourinary Comments: Normal external female genitalia; no CMT, suprapubic or adnexal tenderness. Cervix is long, closed and thick.   Musculoskeletal: Normal range of motion.  Neurological: She is alert.  Skin: Skin is warm and dry.  Psychiatric: She has a normal mood and affect.  No CVAT.   MAU Course  Procedures  MDM  -CBC, CMP and PCR are normal  -Initial BP is elevated in MAU; will draw pre-e labs and cancel labs done today in clinic.   -Wet prep: positive for clue, will treat presumptively.   -NST: difficult to trace baby due to gestation and fetal movements but mother reports strong fetal movements in MAU.  130 bpm, mod var,  Occasional loss of contact with infrequent non-repetitive variables, no contractions. NST is appropriate for gestational age.   Patient has not had any contractions or abdominal pain since she has been in MAU. No episodes of diarrhea.   Will send urine for culture due to elevated Hgb in urine.  Patient now with gestational hypertension; will send message to clinic to begin antenatal testing.     Assessment and Plan   1. Bacterial vaginosis   2. Round ligament pain   3. Gestational hypertension affecting first pregnancy    2. Patient stable for discharge; will send message to clinic to begin antenatal testing. Patient understands diagnosis and need for increased monitoring in pregnancy. Reviewed warning signs and when to return to MAU.   3. RX for Flagyl given.   4. All questions answered.  Charlesetta Garibaldi Laura Estrada 06/01/2018, 8:17 PM

## 2018-06-01 NOTE — Discharge Instructions (Signed)
Hypertension During Pregnancy Hypertension is also called high blood pressure. High blood pressure means that the force of your blood moving in your body is too strong. When you are pregnant, this condition should be watched carefully. It can cause problems for you and your baby. Follow these instructions at home: Eating and drinking  Drink enough fluid to keep your pee (urine) clear or pale yellow.  Eat healthy foods that are low in salt (sodium). ? Do not add salt to your food. ? Check labels on foods and drinks to see much salt is in them. Look on the label where you see "Sodium." Lifestyle  Do not use any products that contain nicotine or tobacco, such as cigarettes and e-cigarettes. If you need help quitting, ask your doctor.  Do not use alcohol.  Avoid caffeine.  Avoid stress. Rest and get plenty of sleep. General instructions  Take over-the-counter and prescription medicines only as told by your doctor.  While lying down, lie on your left side. This keeps pressure off your baby.  While sitting or lying down, raise (elevate) your feet. Try putting some pillows under your lower legs.  Exercise regularly. Ask your doctor what kinds of exercise are best for you.  Keep all prenatal and follow-up visits as told by your doctor. This is important. Contact a doctor if:  You have symptoms that your doctor told you to watch for, such as: ? Fever. ? Throwing up (vomiting). ? Headache. Get help right away if:  You have very bad pain in your belly (abdomen).  You are throwing up, and this does not get better with treatment.  You suddenly get swelling in your hands, ankles, or face.  You gain 4 lb (1.8 kg) or more in 1 week.  You get bleeding from your vagina.  You have blood in your pee.  You do not feel your baby moving as much as normal.  You have a change in vision.  You have muscle twitching or sudden tightening (spasms).  You have trouble breathing.  Your lips  or fingernails turn blue. This information is not intended to replace advice given to you by your health care provider. Make sure you discuss any questions you have with your health care provider. Document Released: 12/27/2010 Document Revised: 08/05/2016 Document Reviewed: 08/05/2016 Elsevier Interactive Patient Education  2018 [employer]. Round Ligament Pain The round ligament is a cord of muscle and tissue that helps to support the uterus. It can become a source of pain during pregnancy if it becomes stretched or twisted as the baby grows. The pain usually begins in the second trimester of pregnancy, and it can come and go until the baby is delivered. It is not a serious problem, and it does not cause harm to the baby. Round ligament pain is usually a short, sharp, and pinching pain, but it can also be a dull, lingering, and aching pain. The pain is felt in the lower side of the abdomen or in the groin. It usually starts deep in the groin and moves up to the outside of the hip area. Pain can occur with:  A sudden change in position.  Rolling over in bed.  Coughing or sneezing.  Physical activity.  Follow these instructions at home: Watch your condition for any changes. Take these steps to help with your pain:  When the pain starts, relax. Then try: ? Sitting down. ? Flexing your knees up to your abdomen. ? Lying on your side with one pillow  under your abdomen and another pillow between your legs. ? Sitting in a warm bath for 15-20 minutes or until the pain goes away.  Take over-the-counter and prescription medicines only as told by your health care provider.  Move slowly when you sit and stand.  Avoid long walks if they cause pain.  Stop or lessen your physical activities if they cause pain.  Contact a health care provider if:  Your pain does not go away with treatment.  You feel pain in your back that you did not have before.  Your medicine is not helping. Get help  right away if:  You develop a fever or chills.  You develop uterine contractions.  You develop vaginal bleeding.  You develop nausea or vomiting.  You develop diarrhea.  You have pain when you urinate. This information is not intended to replace advice given to you by your health care provider. Make sure you discuss any questions you have with your health care provider. Document Released: 09/02/2008 Document Revised: 05/01/2016 Document Reviewed: 01/31/2015 Elsevier Interactive Patient Education  2018 [employer] Bacterial Vaginosis Bacterial vaginosis is an infection of the vagina. It happens when too many germs (bacteria) grow in the vagina. This infection puts you at risk for infections from sex (STIs). Treating this infection can lower your risk for some STIs. You should also treat this if you are pregnant. It can cause your baby to be born early. Follow these instructions at home: Medicines  Take over-the-counter and prescription medicines only as told by your doctor.  Take or use your antibiotic medicine as told by your doctor. Do not stop taking or using it even if you start to feel better. General instructions  If you your sexual partner is a woman, tell her that you have this infection. She needs to get treatment if she has symptoms. If you have a female partner, he does not need to be treated.  During treatment: ? Avoid sex. ? Do not douche. ? Avoid alcohol as told. ? Avoid breastfeeding as told.  Drink enough fluid to keep your pee (urine) clear or pale yellow.  Keep your vagina and butt (rectum) clean. ? Wash the area with warm water every day. ? Wipe from front to back after you use the toilet.  Keep all follow-up visits as told by your doctor. This is important. Preventing this condition  Do not douche.  Use only warm water to wash around your vagina.  Use protection when you have sex. This includes: ? Latex condoms. ? Dental dams.  Limit how many  people you have sex with. It is best to only have sex with the same person (be monogamous).  Get tested for STIs. Have your partner get tested.  Wear underwear that is cotton or lined with cotton.  Avoid tight pants and pantyhose. This is most important in summer.  Do not use any products that have nicotine or tobacco in them. These include cigarettes and e-cigarettes. If you need help quitting, ask your doctor.  Do not use illegal drugs.  Limit how much alcohol you drink. Contact a doctor if:  Your symptoms do not get better, even after you are treated.  You have more discharge or pain when you pee (urinate).  You have a fever.  You have pain in your belly (abdomen).  You have pain with sex.  Your bleed from your vagina between periods. Summary  This infection happens when too many germs (bacteria) grow in the vagina.  Treating  this condition can lower your risk for some infections from sex (STIs).  You should also treat this if you are pregnant. It can cause early (premature) birth.  Do not stop taking or using your antibiotic medicine even if you start to feel better. This information is not intended to replace advice given to you by your health care provider. Make sure you discuss any questions you have with your health care provider. Document Released: 09/02/2008 Document Revised: 08/09/2016 Document Reviewed: 08/09/2016 Elsevier Interactive Patient Education  2017 [employer].

## 2018-06-01 NOTE — MAU Note (Signed)
Having really bad cramps, stomach is getting tight.  Started around 3, getting worse.  Had some thick gooey d/c.  Started having diarrhea 2 days ago, soft/loose is green. 4x in 24 hrs

## 2018-06-01 NOTE — Progress Notes (Signed)
    Subjective:  Laura Estrada is a 22 y.o. G2P0010 at 8469w2d being seen today for ongoing prenatal care.  She is currently monitored for the following issues for this low-risk pregnancy and has Chlamydia infection affecting pregnancy; Supervision of low-risk first pregnancy; Rh negative state in antepartum period, second trimester; and Morbid obesity with BMI of 40.0-44.9, adult (HCC) on their problem list.  Patient reports no complaints.  Contractions: Not present. Vag. Bleeding: None.  Movement: Present. Denies leaking of fluid.   The following portions of the patient's history were reviewed and updated as appropriate: allergies, current medications, past family history, past medical history, past social history, past surgical history and problem list. Problem list updated.  Objective:   Vitals:   06/01/18 0832 06/01/18 0836  BP: 127/90 135/70  Pulse: 94 96  Weight: 269 lb 12.8 oz (122.4 kg)     Fetal Status: Fetal Heart Rate (bpm): 124 Fundal Height: 29 cm Movement: Present     General:  Alert, oriented and cooperative. Patient is in no acute distress.  Skin: Skin is warm and dry. No rash noted.   Cardiovascular: Normal heart rate noted  Respiratory: Normal respiratory effort, no problems with respiration noted  Abdomen: Soft, gravid, appropriate for gestational age. Pain/Pressure: Absent     Pelvic:  Cervical exam deferred        Extremities: Normal range of motion.  Edema: None  Mental Status: Normal mood and affect. Normal behavior. Normal judgment and thought content.   Urinalysis:      Assessment and Plan:  Pregnancy: G2P0010 at 1669w2d  1. Encounter for supervision of low-risk first pregnancy, antepartum OF note, her BP is not high but is going up - at one MAU visit in early pregnancy, BP was definitely elevated.  Family history of elevated BPs so will get baseline PIH labs today.  - Tdap vaccine greater than or equal to 7yo IM - rho (d) immune globulin (RHIG/RHOPHYLAC)  injection 300 mcg - Comprehensive metabolic panel - Protein / creatinine ratio, urine - Urinalysis, Routine w reflex microscopic  Preterm labor symptoms and general obstetric precautions including but not limited to vaginal bleeding, contractions, leaking of fluid and fetal movement were reviewed in detail with the patient. Please refer to After Visit Summary for other counseling recommendations.  Return in about 2 weeks (around 06/15/2018).  Nolene BernheimERRI BURLESON, RN, MSN, NP-BC Nurse Practitioner, [facility], [facility] 06/01/2018 9:06 AM

## 2018-06-01 NOTE — Patient Instructions (Addendum)
Childbirth Education Options: [facility] Department Classes:  Childbirth education classes can help you get ready for a positive parenting experience. You can also meet other expectant parents and get free stuff for your baby. Each class runs for five weeks on the same night and costs $45 for the mother-to-be and her support person. Medicaid covers the cost if you are eligible. Call [phone] to register. [facility] Childbirth Education:  [phone] or [phone] or [email or web address]  Baby & Me Class: Discuss newborn & infant parenting and family adjustment issues with other new mothers in a relaxed environment. Each week brings a new speaker or baby-centered activity. We encourage new mothers to join [place] every Thursday at 11:00am. Babies birth until crawling. No registration or fee. Daddy [employer]: This course offers Dads-to-be the tools and knowledge needed to feel confident on their journey to becoming new fathers. Experienced dads, who have been trained as coaches, teach dads-to-be how to hold, comfort, diaper, swaddle and play with their infant while being able to support the new mom as well. A class for men taught by men. $25/dad Big Brother/Big Sister: Let your children share in the joy of a new brother or sister in this special class designed just for them. Class includes discussion about how families care for babies: swaddling, holding, diapering, safety as well as how they can be helpful in their new role. This class is designed for children ages 45 to 48, but any age is welcome. Please register each child individually. $5/child  Mom Talk: This mom-led group offers support and connection to mothers as they journey through the adjustments and struggles of that sometimes overwhelming first year after the birth of a child. Tuesdays at 10:00am and Thursdays at 6:00pm. Babies welcome. No registration or fee. Breastfeeding Support Group: This group is a mother-to-mother  support circle where moms have the opportunity to share their breastfeeding experiences. A Lactation Consultant is present for questions and concerns. Meets each Tuesday at 11:00am. No fee or registration. Breastfeeding Your Baby: Learn what to expect in the first days of breastfeeding your newborn.  This class will help you feel more confident with the skills needed to begin your breastfeeding experience. Many new mothers are concerned about breastfeeding after leaving the hospital. This class will also address the most common fears and challenges about breastfeeding during the first few weeks, months and beyond. (call for fee) Comfort Techniques and Tour: This 2 hour interactive class will provide you the opportunity to learn & practice hands-on techniques that can help relieve some of the discomfort of labor and encourage your baby to rotate toward the best position for birth. You and your partner will be able to try a variety of labor positions with birth balls and rebozos as well as practice breathing, relaxation, and visualization techniques. A tour of the [facility] is included with this class. $20 per registrant and support person Childbirth Class- Weekend Option: This class is a Weekend version of our Birth & Baby series. It is designed for parents who have a difficult time fitting several weeks of classes into their schedule. It covers the care of your newborn and the basics of labor and childbirth. It also includes a [place] of [facility] and lunch. The class is held two consecutive days: beginning on Friday evening from 6:30 - 8:30 p.m. and the next day, Saturday from 9 a.m. - 4 p.m. (call for fee) Doren Custard Class: Interested in a waterbirth?  This  informational class will help you discover whether waterbirth is the right fit for you. Education about waterbirth itself, supplies you would need and how to assemble your support team is what you can  expect from this class. Some obstetrical practices require this class in order to pursue a waterbirth. (Not all obstetrical practices offer waterbirth-check with your healthcare provider.) Register only the expectant mom, but you are encouraged to bring your partner to class! Required if planning waterbirth, no fee. Infant/Child CPR: Parents, grandparents, babysitters, and friends learn Cardio-Pulmonary Resuscitation skills for infants and children. You will also learn how to treat both conscious and unconscious choking in infants and children. This Family & Friends program does not offer certification. Register each participant individually to ensure that enough mannequins are available. (Call for fee) Grandparent Love: Expecting a grandbaby? This class is for you! Learn about the latest infant care and safety recommendations and ways to support your own child as he or she transitions into the parenting role. Taught by Registered Nurses who are childbirth instructors, but most importantly...they are grandmothers too! $10/person. Childbirth Class- Natural Childbirth: This series of 5 weekly classes is for expectant parents who want to learn and practice natural methods of coping with the process of labor and childbirth. Relaxation, breathing, massage, visualization, role of the partner, and helpful positioning are highlighted. Participants learn how to be confident in their body's ability to give birth. This class will empower and help parents make informed decisions about their own care. Includes discussion that will help new parents transition into the immediate postpartum period. Maternity Care Center Tour of [facility] is included. We suggest taking this class between 25-32 weeks, but it's only a recommendation. $75 per registrant and one support person or $30 Medicaid. Childbirth Class- 3 week Series: This option of 3 weekly classes helps you and your labor partner prepare for childbirth. Newborn  care, labor & birth, cesarean birth, pain management, and comfort techniques are discussed and a Maternity Care Center Tour of [facility] is included. The class meets at the same time, on the same day of the week for 3 consecutive weeks beginning with the starting date you choose. $60 for registrant and one support person.  Marvelous Multiples: Expecting twins, triplets, or more? This class covers the differences in labor, birth, parenting, and breastfeeding issues that face multiples' parents. NICU tour is included. Led by a Certified Childbirth Educator who is the mother of twins. No fee. Caring for Baby: This class is for expectant and adoptive parents who want to learn and practice the most up-to-date newborn care for their babies. Focus is on birth through the first six weeks of life. Topics include feeding, bathing, diapering, crying, umbilical cord care, circumcision care and safe sleep. Parents learn to recognize symptoms of illness and when to call the pediatrician. Register only the mom-to-be and your partner or support person can plan to come with you! $10 per registrant and support person Childbirth Class- online option: This online class offers you the freedom to complete a Birth and Baby series in the comfort of your own home. The flexibility of this option allows you to review sections at your own pace, at times convenient to you and your support people. It includes additional video information, animations, quizzes, and extended activities. Get organized with helpful eClass tools, checklists, and trackers. Once you register online for the class, you will receive an email within a few days to accept the invitation and begin the class when the time   is right for you. The content will be available to you for 60 days. $60 for 60 days of online access for you and your support people.  Local Doulas: Natural Baby Doulas [email or web address] Tel:  [phone] [email or web address] [employer] [phone] [email or web address] The Labor Hassell Halim  (also do waterbirth tub rental) [phone] [email or web address] [facility] Birth Doula [phone] [email or web address] Sacred Rhythms  [phone] [email or web address]/ [employer] (PADA) [email or web address] [place] Birth [facility] Baby  [email or web address] Considering Waterbirth? Guide for patients at [facility] [employer]  Why consider waterbirth?  . Gentle birth for babies . Less pain medicine used in labor . May allow for passive descent/less pushing . May reduce perineal tears  . More mobility and instinctive maternal position changes . Increased maternal relaxation . Reduced blood pressure in labor  Is waterbirth safe? What are the risks of infection, drowning or other complications?  . Infection: o Very low risk (3.7 % for tub vs 4.8% for bed) o 7 in 8000 waterbirths with documented infection o Poorly cleaned equipment most common cause o Slightly lower group B strep transmission rate  . Drowning o Maternal:  - Very low risk   - Related to seizures or fainting o Newborn:  - Very low risk. No evidence of increased risk of respiratory problems in multiple large studies - Physiological protection from breathing under water - Avoid underwater birth if there are any fetal complications - Once baby's head is out of the water, keep it out.  . Birth complication o Some reports of cord trauma, but risk decreased by bringing baby to surface gradually o No evidence of increased risk of shoulder dystocia. Mothers can usually change positions faster in water than in a bed, possibly aiding the maneuvers to free the shoulder.   You must attend a Doren Custard class at [facility]  3rd Wednesday of every month from 7-9pm  [employer] by calling [phone] or online at [email or web address]  Bring [place] the certificate from the class to your prenatal appointment  Meet with a midwife at 36 weeks to see if you can still plan a waterbirth and to sign the consent.   Purchase or rent the following supplies:   Water Birth Pool (Birth Pool in a Box or [place] for instance)  (Tubs start ~$125)  Single-use disposable tub liner designed for your brand of tub  New garden hose labeled "lead-free", "suitable for drinking water",  Electric drain pump to remove water (We recommend 792 gallon per hour or greater pump.)   Separate garden hose to remove the dirty water  Fish net  Bathing suit top (optional)  Long-handled mirror (optional)  Places to purchase or rent supplies  [email or web address] for tub purchases and supplies  Waterbirthsolutions.com for tub purchases and supplies  The Labor Ladies (www.[email or web address]) $275 for tub rental/set-up & take down/kit   [employer] (http://www.fleming.com/.htm) Information regarding doulas (labor support) who provide pool rentals  Our practice has a Birth Pool in a Box tub at the hospital that you may borrow on a first-come-first-served basis. It is your responsibility to to set up, clean and break down the tub. We cannot guarantee the availability of this tub in advance. You are responsible for bringing all accessories listed above. If you do not have all necessary supplies you cannot have a waterbirth.    Things that would prevent you from having a waterbirth:  Premature, <37wks  Previous cesarean birth  Presence of thick meconium-stained fluid  Multiple gestation (Twins,  triplets, etc.)  Uncontrolled diabetes or gestational diabetes requiring medication  Hypertension requiring medication or diagnosis of pre-eclampsia  Heavy vaginal bleeding  Non-reassuring fetal  heart rate  Active infection (MRSA, etc.). Group B Strep is NOT a contraindication for  waterbirth.  If your labor has to be induced and induction method requires continuous  monitoring of the baby's heart rate  Other risks/issues identified by your obstetrical provider  Please remember that birth is unpredictable. Under certain unforeseeable circumstances your provider may advise against giving birth in the tub. These decisions will be made on a case-by-case basis and with the safety of you and your baby as our highest priority.    [email or web address] Rh Incompatibility Rh incompatibility is a condition that occurs during pregnancy if a woman has Rh-negative blood and her baby has Rh-positive blood. "Rh-negative" and "Rh-positive" refer to whether or not the blood has an Rh factor. An Rh factor is a specific protein found on the surface of red blood cells. If a woman has Rh factor, she is Rh-positive. If she does not have an Rh factor, she is Rh-negative. Having or not having an Rh factor does not affect the mother's general health. However, it can cause problems during pregnancy. What kind of problems can Rh incompatibility cause? During pregnancy, blood from the baby can cross into the mother's bloodstream, especially during delivery. If a mother is Rh-negative and the baby is Rh-positive, the mother's defense system will react to the baby's blood as if it was a foreign substance and will create proteins (antibodies). This is called sensitization. Once the mother is sensitized, her Rh antibodies will cross the placenta to the baby and attack the baby's Rh-positive blood as if it is a harmful substance. Rh incompatibility can also happen if the Rh-negative pregnant woman is exposed to the Rh factor during a blood transfusion with Rh-positive blood. How does this condition affect my baby? The Rh antibodies that attack and destroy the baby's red blood cells can lead to hemolytic disease in the  baby. Hemolytic disease is when the red blood cells break down. This can cause:  Yellowing of the skin and eyes (jaundice).  The body to not have enough healthy red blood cells (anemia).  Brain damage.  Heart failure.  Death.  These antibodies usually do not cause problems during a first pregnancy. This is because the blood from the baby often times crosses into the mother's bloodstream during delivery, and the baby is born before many of the antibodies can develop. However, the antibodies stay in your body once they have formed. Because of this, Rh incompatibility is more likely to cause problems in second or later pregnancies (if the baby is Rh-positive). How is this diagnosed? When a woman becomes pregnant, blood tests may be done to find out her blood type and Rh factor. If the woman is Rh-negative, she also may have another blood test called an antibody screen. The antibody screen shows whether she has Rh antibodies in her blood. If she does, it means she was exposed to Rh-positive blood before, and she is at risk for Rh incompatibility. To find out whether the baby is developing hemolytic anemia and how serious it is, caregivers may use more advanced tests, such as ultrasonography (commonly known as ultrasound). How is Rh incompatibility treated? Rh incompatibility is treated with a shot of medicine called Rho (D) immune globulin. This medicine keeps the woman's body from making antibodies that can cause serious problems in the baby or future  babies. Two shots will be given, one at around your seventh month of pregnancy and the other within 72 hours of your baby being born. If you are Rh-negative, you will need this medicine every time you have a baby with Rh-positive blood. If you already have antibodies in your blood, Rho (D) immune globulin will not help. Your doctor will not give you this medicine, but will watch your pregnancy closely for problems instead. This shot may also be given to  an Rh-negative woman when the risk of blood transfer between the mom and baby is high. The risk is high with:  An amniocentesis.  A miscarriage or an abortion.  An ectopic pregnancy.  Any vaginal bleeding during pregnancy.  This information is not intended to replace advice given to you by your health care provider. Make sure you discuss any questions you have with your health care provider. Document Released: 05/16/2002 Document Revised: 05/01/2016 Document Reviewed: 03/08/2013 Elsevier Interactive Patient Education  2017 [employer].

## 2018-06-02 LAB — COMPREHENSIVE METABOLIC PANEL
ALBUMIN: 4.1 g/dL (ref 3.5–5.5)
ALK PHOS: 94 IU/L (ref 39–117)
ALT: 37 IU/L — ABNORMAL HIGH (ref 0–32)
AST: 18 IU/L (ref 0–40)
Albumin/Globulin Ratio: 1.6 (ref 1.2–2.2)
BUN/Creatinine Ratio: 9 (ref 9–23)
BUN: 5 mg/dL — AB (ref 6–20)
Bilirubin Total: 0.2 mg/dL (ref 0.0–1.2)
CALCIUM: 9 mg/dL (ref 8.7–10.2)
CO2: 16 mmol/L — AB (ref 20–29)
CREATININE: 0.57 mg/dL (ref 0.57–1.00)
Chloride: 104 mmol/L (ref 96–106)
GFR calc Af Amer: 151 mL/min/{1.73_m2} (ref >59)
GFR, EST NON AFRICAN AMERICAN: 131 mL/min/{1.73_m2} (ref >59)
GLUCOSE: 149 mg/dL — AB (ref 65–99)
Globulin, Total: 2.5 g/dL (ref 1.5–4.5)
Potassium: 4 mmol/L (ref 3.5–5.2)
Sodium: 138 mmol/L (ref 134–144)
Total Protein: 6.6 g/dL (ref 6.0–8.5)

## 2018-06-02 LAB — CBC
HEMATOCRIT: 33.1 % — AB (ref 34.0–46.6)
Hemoglobin: 11.1 g/dL (ref 11.1–15.9)
MCH: 26.5 pg — AB (ref 26.6–33.0)
MCHC: 33.5 g/dL (ref 31.5–35.7)
MCV: 79 fL (ref 79–97)
PLATELETS: 292 10*3/uL (ref 150–450)
RBC: 4.19 x10E6/uL (ref 3.77–5.28)
RDW: 16.4 % — AB (ref 12.3–15.4)
WBC: 11.9 10*3/uL — AB (ref 3.4–10.8)

## 2018-06-02 LAB — RPR: RPR Ser Ql: NONREACTIVE

## 2018-06-02 LAB — GLUCOSE TOLERANCE, 2 HOURS W/ 1HR
GLUCOSE, 2 HOUR: 142 mg/dL (ref 65–152)
Glucose, 1 hour: 173 mg/dL (ref 65–179)
Glucose, Fasting: 104 mg/dL — ABNORMAL HIGH (ref 65–91)

## 2018-06-02 LAB — GC/CHLAMYDIA PROBE AMP (~~LOC~~) NOT AT ARMC
CHLAMYDIA, DNA PROBE: NEGATIVE
Neisseria Gonorrhea: NEGATIVE

## 2018-06-02 LAB — HIV ANTIBODY (ROUTINE TESTING W REFLEX): HIV Screen 4th Generation wRfx: NONREACTIVE

## 2018-06-02 NOTE — Addendum Note (Signed)
Addended by: Kathee DeltonHILLMAN, Pravin Perezperez L on: 06/02/2018 03:44 PM   Modules accepted: Orders

## 2018-06-03 ENCOUNTER — Other Ambulatory Visit: Payer: Self-pay | Admitting: General Practice

## 2018-06-03 ENCOUNTER — Encounter: Payer: Self-pay | Admitting: General Practice

## 2018-06-03 DIAGNOSIS — O133 Gestational [pregnancy-induced] hypertension without significant proteinuria, third trimester: Secondary | ICD-10-CM

## 2018-06-03 LAB — URINALYSIS, ROUTINE W REFLEX MICROSCOPIC
BILIRUBIN UA: NEGATIVE
KETONES UA: NEGATIVE
LEUKOCYTES UA: NEGATIVE
NITRITE UA: NEGATIVE
Protein, UA: NEGATIVE
SPEC GRAV UA: 1.021 (ref 1.005–1.030)
Urobilinogen, Ur: 0.2 mg/dL (ref 0.2–1.0)
pH, UA: 6.5 (ref 5.0–7.5)

## 2018-06-03 LAB — MICROSCOPIC EXAMINATION
BACTERIA UA: NONE SEEN
Casts: NONE SEEN /lpf

## 2018-06-03 LAB — PROTEIN / CREATININE RATIO, URINE
Creatinine, Urine: 198.3 mg/dL
PROTEIN/CREAT RATIO: 174 mg/g{creat} (ref 0–200)
Protein, Ur: 34.6 mg/dL

## 2018-06-03 LAB — CULTURE, OB URINE: Culture: 80000 — AB

## 2018-06-04 ENCOUNTER — Encounter: Payer: Self-pay | Admitting: Nurse Practitioner

## 2018-06-04 DIAGNOSIS — O24419 Gestational diabetes mellitus in pregnancy, unspecified control: Secondary | ICD-10-CM | POA: Insufficient documentation

## 2018-06-08 MED ORDER — ACCU-CHEK GUIDE W/DEVICE KIT: 1.0000 | PACK | Freq: Four times a day (QID) | 0 refills | Status: AC

## 2018-06-08 MED ORDER — ACCU-CHEK FASTCLIX LANCETS MISC: 1.0000 | Freq: Four times a day (QID) | 3 refills | Status: AC

## 2018-06-08 MED ORDER — GLUCOSE BLOOD VI STRP: ORAL_STRIP | 12 refills | Status: AC

## 2018-06-08 NOTE — Progress Notes (Signed)
Diabetic supplies sent to pharmacy.  Left message informing patient to Mychart message.

## 2018-06-14 ENCOUNTER — Encounter (HOSPITAL_COMMUNITY): Payer: Self-pay

## 2018-06-14 ENCOUNTER — Other Ambulatory Visit (HOSPITAL_COMMUNITY): Payer: Self-pay | Admitting: *Deleted

## 2018-06-14 ENCOUNTER — Other Ambulatory Visit: Payer: Self-pay | Admitting: Student

## 2018-06-14 ENCOUNTER — Ambulatory Visit (HOSPITAL_COMMUNITY)
Admission: RE | Admit: 2018-06-14 | Discharge: 2018-06-14 | Disposition: A | Discharge status: Home / Self Care | Payer: Medicaid Other | Source: Ambulatory Visit | Attending: Student | Admitting: Student

## 2018-06-14 DIAGNOSIS — Z3A29 29 weeks gestation of pregnancy: Secondary | ICD-10-CM | POA: Diagnosis not present

## 2018-06-14 DIAGNOSIS — Z362 Encounter for other antenatal screening follow-up: Secondary | ICD-10-CM | POA: Diagnosis not present

## 2018-06-14 DIAGNOSIS — O133 Gestational [pregnancy-induced] hypertension without significant proteinuria, third trimester: Secondary | ICD-10-CM

## 2018-06-14 DIAGNOSIS — O99213 Obesity complicating pregnancy, third trimester: Secondary | ICD-10-CM | POA: Diagnosis not present

## 2018-06-14 DIAGNOSIS — O24410 Gestational diabetes mellitus in pregnancy, diet controlled: Secondary | ICD-10-CM

## 2018-06-15 ENCOUNTER — Ambulatory Visit (INDEPENDENT_AMBULATORY_CARE_PROVIDER_SITE_OTHER): Payer: Medicaid Other | Admitting: Advanced Practice Midwife

## 2018-06-15 VITALS — BP 135/74 | HR 88 | Wt 268.0 lb

## 2018-06-15 DIAGNOSIS — O24419 Gestational diabetes mellitus in pregnancy, unspecified control: Secondary | ICD-10-CM

## 2018-06-15 DIAGNOSIS — O163 Unspecified maternal hypertension, third trimester: Secondary | ICD-10-CM | POA: Insufficient documentation

## 2018-06-15 DIAGNOSIS — Z3400 Encounter for supervision of normal first pregnancy, unspecified trimester: Secondary | ICD-10-CM

## 2018-06-15 MED ORDER — PRENATAL COMPLETE 14-0.4 MG PO TABS: 1.0000 | ORAL_TABLET | Freq: Every day | ORAL | 10 refills | Status: AC

## 2018-06-15 MED ORDER — PRENATAL COMPLETE 14-0.4 MG PO TABS: 1.0000 | ORAL_TABLET | Freq: Every day | ORAL | 10 refills | Status: DC

## 2018-06-15 NOTE — Addendum Note (Signed)
Addended by: Ernestina PatchesAPEL, Jaquavian Firkus S on: 06/15/2018 03:00 PM   Modules accepted: Orders

## 2018-06-15 NOTE — Progress Notes (Signed)
Needs a Refill on Prenatal Vitamins

## 2018-06-15 NOTE — Patient Instructions (Signed)
Third Trimester of Pregnancy The third trimester is from week 28 through week 40 (months 7 through 9). The third trimester is a time when the unborn baby (fetus) is growing rapidly. At the end of the ninth month, the fetus is about 20 inches in length and weighs 6-10 pounds. Body changes during your third trimester Your body will continue to go through many changes during pregnancy. The changes vary from woman to woman. During the third trimester:  Your weight will continue to increase. You can expect to gain 25-35 pounds (11-16 kg) by the end of the pregnancy.  You may begin to get stretch marks on your hips, abdomen, and breasts.  You may urinate more often because the fetus is moving lower into your pelvis and pressing on your bladder.  You may develop or continue to have heartburn. This is caused by increased hormones that slow down muscles in the digestive tract.  You may develop or continue to have constipation because increased hormones slow digestion and cause the muscles that push waste through your intestines to relax.  You may develop hemorrhoids. These are swollen veins (varicose veins) in the rectum that can itch or be painful.  You may develop swollen, bulging veins (varicose veins) in your legs.  You may have increased body aches in the pelvis, back, or thighs. This is due to weight gain and increased hormones that are relaxing your joints.  You may have changes in your hair. These can include thickening of your hair, rapid growth, and changes in texture. Some women also have hair loss during or after pregnancy, or hair that feels dry or thin. Your hair will most likely return to normal after your baby is born.  Your breasts will continue to grow and they will continue to become tender. A yellow fluid (colostrum) may leak from your breasts. This is the first milk you are producing for your baby.  Your belly button may stick out.  You may notice more swelling in your hands,  face, or ankles.  You may have increased tingling or numbness in your hands, arms, and legs. The skin on your belly may also feel numb.  You may feel short of breath because of your expanding uterus.  You may have more problems sleeping. This can be caused by the size of your belly, increased need to urinate, and an increase in your body's metabolism.  You may notice the fetus "dropping," or moving lower in your abdomen (lightening).  You may have increased vaginal discharge.  You may notice your joints feel loose and you may have pain around your pelvic bone.  What to expect at prenatal visits You will have prenatal exams every 2 weeks until week 36. Then you will have weekly prenatal exams. During a routine prenatal visit:  You will be weighed to make sure you and the baby are growing normally.  Your blood pressure will be taken.  Your abdomen will be measured to track your baby's growth.  The fetal heartbeat will be listened to.  Any test results from the previous visit will be discussed.  You may have a cervical check near your due date to see if your cervix has softened or thinned (effaced).  You will be tested for Group B streptococcus. This happens between 35 and 37 weeks.  Your health care provider may ask you:  What your birth plan is.  How you are feeling.  If you are feeling the baby move.  If you have had   any abnormal symptoms, such as leaking fluid, bleeding, severe headaches, or abdominal cramping.  If you are using any tobacco products, including cigarettes, chewing tobacco, and electronic cigarettes.  If you have any questions.  Other tests or screenings that may be performed during your third trimester include:  Blood tests that check for low iron levels (anemia).  Fetal testing to check the health, activity level, and growth of the fetus. Testing is done if you have certain medical conditions or if there are problems during the  pregnancy.  Nonstress test (NST). This test checks the health of your baby to make sure there are no signs of problems, such as the baby not getting enough oxygen. During this test, a belt is placed around your belly. The baby is made to move, and its heart rate is monitored during movement.  What is false labor? False labor is a condition in which you feel small, irregular tightenings of the muscles in the womb (contractions) that usually go away with rest, changing position, or drinking water. These are called Braxton Hicks contractions. Contractions may last for hours, days, or even weeks before true labor sets in. If contractions come at regular intervals, become more frequent, increase in intensity, or become painful, you should see your health care provider. What are the signs of labor?  Abdominal cramps.  Regular contractions that start at 10 minutes apart and become stronger and more frequent with time.  Contractions that start on the top of the uterus and spread down to the lower abdomen and back.  Increased pelvic pressure and dull back pain.  A watery or bloody mucus discharge that comes from the vagina.  Leaking of amniotic fluid. This is also known as your "water breaking." It could be a slow trickle or a gush. Let your health care provider know if it has a color or strange odor. If you have any of these signs, call your health care provider right away, even if it is before your due date. Follow these instructions at home: Medicines  Follow your health care provider's instructions regarding medicine use. Specific medicines may be either safe or unsafe to take during pregnancy.  Take a prenatal vitamin that contains at least 600 micrograms (mcg) of folic acid.  If you develop constipation, try taking a stool softener if your health care provider approves. Eating and drinking  Eat a balanced diet that includes fresh fruits and vegetables, whole grains, good sources of protein  such as meat, eggs, or tofu, and low-fat dairy. Your health care provider will help you determine the amount of weight gain that is right for you.  Avoid raw meat and uncooked cheese. These carry germs that can cause birth defects in the baby.  If you have low calcium intake from food, talk to your health care provider about whether you should take a daily calcium supplement.  Eat four or five small meals rather than three large meals a day.  Limit foods that are high in fat and processed sugars, such as fried and sweet foods.  To prevent constipation: ? Drink enough fluid to keep your urine clear or pale yellow. ? Eat foods that are high in fiber, such as fresh fruits and vegetables, whole grains, and beans. Activity  Exercise only as directed by your health care provider. Most women can continue their usual exercise routine during pregnancy. Try to exercise for 30 minutes at least 5 days a week. Stop exercising if you experience uterine contractions.  Avoid heavy   lifting.  Do not exercise in extreme heat or humidity, or at high altitudes.  Wear low-heel, comfortable shoes.  Practice good posture.  You may continue to have sex unless your health care provider tells you otherwise. Relieving pain and discomfort  Take frequent breaks and rest with your legs elevated if you have leg cramps or low back pain.  Take warm sitz baths to soothe any pain or discomfort caused by hemorrhoids. Use hemorrhoid cream if your health care provider approves.  Wear a good support bra to prevent discomfort from breast tenderness.  If you develop varicose veins: ? Wear support pantyhose or compression stockings as told by your healthcare provider. ? Elevate your feet for 15 minutes, 3-4 times a day. Prenatal care  Write down your questions. Take them to your prenatal visits.  Keep all your prenatal visits as told by your health care provider. This is important. Safety  Wear your seat belt at  all times when driving.  Make a list of emergency phone numbers, including numbers for family, friends, the hospital, and police and fire departments. General instructions  Avoid cat litter boxes and soil used by cats. These carry germs that can cause birth defects in the baby. If you have a cat, ask someone to clean the litter box for you.  Do not travel far distances unless it is absolutely necessary and only with the approval of your health care provider.  Do not use hot tubs, steam rooms, or saunas.  Do not drink alcohol.  Do not use any products that contain nicotine or tobacco, such as cigarettes and e-cigarettes. If you need help quitting, ask your health care provider.  Do not use any medicinal herbs or unprescribed drugs. These chemicals affect the formation and growth of the baby.  Do not douche or use tampons or scented sanitary pads.  Do not cross your legs for long periods of time.  To prepare for the arrival of your baby: ? Take prenatal classes to understand, practice, and ask questions about labor and delivery. ? Make a trial run to the hospital. ? Visit the hospital and tour the maternity area. ? Arrange for maternity or paternity leave through employers. ? Arrange for family and friends to take care of pets while you are in the hospital. ? Purchase a rear-facing car seat and make sure you know how to install it in your car. ? Pack your hospital bag. ? Prepare the baby's nursery. Make sure to remove all pillows and stuffed animals from the baby's crib to prevent suffocation.  Visit your dentist if you have not gone during your pregnancy. Use a soft toothbrush to brush your teeth and be gentle when you floss. Contact a health care provider if:  You are unsure if you are in labor or if your water has broken.  You become dizzy.  You have mild pelvic cramps, pelvic pressure, or nagging pain in your abdominal area.  You have lower back pain.  You have persistent  nausea, vomiting, or diarrhea.  You have an unusual or bad smelling vaginal discharge.  You have pain when you urinate. Get help right away if:  Your water breaks before 37 weeks.  You have regular contractions less than 5 minutes apart before 37 weeks.  You have a fever.  You are leaking fluid from your vagina.  You have spotting or bleeding from your vagina.  You have severe abdominal pain or cramping.  You have rapid weight loss or weight gain.    You have shortness of breath with chest pain.  You notice sudden or extreme swelling of your face, hands, ankles, feet, or legs.  Your baby makes fewer than 10 movements in 2 hours.  You have severe headaches that do not go away when you take medicine.  You have vision changes. Summary  The third trimester is from week 28 through week 40, months 7 through 9. The third trimester is a time when the unborn baby (fetus) is growing rapidly.  During the third trimester, your discomfort may increase as you and your baby continue to gain weight. You may have abdominal, leg, and back pain, sleeping problems, and an increased need to urinate.  During the third trimester your breasts will keep growing and they will continue to become tender. A yellow fluid (colostrum) may leak from your breasts. This is the first milk you are producing for your baby.  False labor is a condition in which you feel small, irregular tightenings of the muscles in the womb (contractions) that eventually go away. These are called Braxton Hicks contractions. Contractions may last for hours, days, or even weeks before true labor sets in.  Signs of labor can include: abdominal cramps; regular contractions that start at 10 minutes apart and become stronger and more frequent with time; watery or bloody mucus discharge that comes from the vagina; increased pelvic pressure and dull back pain; and leaking of amniotic fluid. This information is not intended to replace advice  given to you by your health care provider. Make sure you discuss any questions you have with your health care provider. Document Released: 11/18/2001 Document Revised: 05/01/2016 Document Reviewed: 01/25/2013 Elsevier Interactive Patient Education  2017 [employer]  

## 2018-06-15 NOTE — Progress Notes (Signed)
   PRENATAL VISIT NOTE  Subjective:  Laura Estrada is a 22 y.o. G2P0010 at 7073w2d being seen today for ongoing prenatal care.  She is currently monitored for the following issues for this high-risk pregnancy and has Chlamydia infection affecting pregnancy; Supervision of low-risk first pregnancy; Rh negative state in antepartum period, second trimester; Morbid obesity with BMI of 40.0-44.9, adult (HCC); Gestational hypertension; and Gestational diabetes on their problem list.  Patient reports no complaints.  Contractions: Not present. Vag. Bleeding: None.  Movement: Present. Denies leaking of fluid.   The following portions of the patient's history were reviewed and updated as appropriate: allergies, current medications, past family history, past medical history, past social history, past surgical history and problem list. Problem list updated.  Objective:   Vitals:   06/15/18 1126  BP: 135/74  Pulse: 88  Weight: 268 lb (121.6 kg)    Fetal Status: Fetal Heart Rate (bpm): 140   Movement: Present     General:  Alert, oriented and cooperative. Patient is in no acute distress.  Skin: Skin is warm and dry. No rash noted.   Cardiovascular: Normal heart rate noted  Respiratory: Normal respiratory effort, no problems with respiration noted  Abdomen: Soft, gravid, appropriate for gestational age.  Pain/Pressure: Absent     Pelvic: Cervical exam deferred        Extremities: Normal range of motion.  Edema: None  Mental Status: Normal mood and affect. Normal behavior. Normal judgment and thought content.   Assessment and Plan:  Pregnancy: G2P0010 at 6273w2d  1. Encounter for supervision of low-risk first pregnancy, antepartum   2. Gestational diabetes mellitus (GDM) in third trimester, gestational diabetes method of control unspecified --New dx, class on 06/16/18.   --Discussed DM in pregnancy, risks, importance of keeping glucose wnl.  3. Elevated blood pressure affecting pregnancy in third  trimester, antepartum --Isolated BP of 130s/90 during previous prenatal visit. Possibly one other BP borderline elevated in MAU visit.  Review of BPs reveals pt BP often 130s/70s-80s.  Pt denies h/a, epigastric pain, or visual disturbances.  Will consider transient HTN for now. Reviewed with Dr Erin FullingHarraway-Smith. Baseline labs done on 6/25.   --Preeclampsia precautions reviewed with pt today/reasons to seek care  Preterm labor symptoms and general obstetric precautions including but not limited to vaginal bleeding, contractions, leaking of fluid and fetal movement were reviewed in detail with the patient. Please refer to After Visit Summary for other counseling recommendations.  No follow-ups on file.  Future Appointments  Date Time Provider Department Center  06/16/2018  3:15 PM [facility] GDM CLASS [facility] NDM  06/28/2018  2:15 PM Adam PhenixArnold, James G, MD [facility]  07/12/2018  2:00 PM [facility] US 3 [facility]    Sharen CounterLisa Leftwich-Kirby, CNM

## 2018-06-16 ENCOUNTER — Encounter: Payer: Medicaid Other | Attending: Obstetrics & Gynecology | Admitting: Registered"

## 2018-06-16 DIAGNOSIS — Z713 Dietary counseling and surveillance: Secondary | ICD-10-CM | POA: Diagnosis present

## 2018-06-16 DIAGNOSIS — O99810 Abnormal glucose complicating pregnancy: Secondary | ICD-10-CM

## 2018-06-16 DIAGNOSIS — Z3A29 29 weeks gestation of pregnancy: Secondary | ICD-10-CM | POA: Diagnosis not present

## 2018-06-16 DIAGNOSIS — O24419 Gestational diabetes mellitus in pregnancy, unspecified control: Secondary | ICD-10-CM | POA: Insufficient documentation

## 2018-06-18 ENCOUNTER — Encounter: Payer: Self-pay | Admitting: Registered"

## 2018-06-18 DIAGNOSIS — O99810 Abnormal glucose complicating pregnancy: Secondary | ICD-10-CM | POA: Insufficient documentation

## 2018-06-18 NOTE — Progress Notes (Signed)

## 2018-06-21 ENCOUNTER — Encounter: Payer: Self-pay | Admitting: Obstetrics & Gynecology

## 2018-06-28 ENCOUNTER — Ambulatory Visit (INDEPENDENT_AMBULATORY_CARE_PROVIDER_SITE_OTHER): Payer: Medicaid Other | Admitting: Obstetrics & Gynecology

## 2018-06-28 VITALS — BP 125/76 | HR 92 | Wt 271.4 lb

## 2018-06-28 DIAGNOSIS — O24419 Gestational diabetes mellitus in pregnancy, unspecified control: Secondary | ICD-10-CM

## 2018-06-28 DIAGNOSIS — Z3403 Encounter for supervision of normal first pregnancy, third trimester: Secondary | ICD-10-CM

## 2018-06-28 IMAGING — US US OB TRANSVAGINAL
1 series · 15 of 28 positions shown · non-contrast
Comparison: Ultrasound December 17, 2017.

CLINICAL DATA: Pregnancy of unknown anatomic location.

EXAM:
TRANSVAGINAL OB ULTRASOUND
TECHNIQUE: Transvaginal ultrasound was performed for complete evaluation of the
gestation as well as the maternal uterus, adnexal regions, and
pelvic cul-de-sac.

[Series 1: us ob transvaginal · 60 acquisitions, 15 frames shown]
[im 1/60]
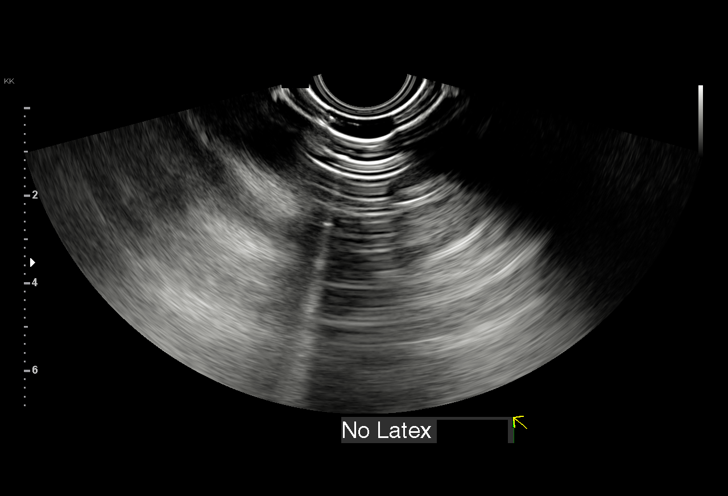
[im 5/60]
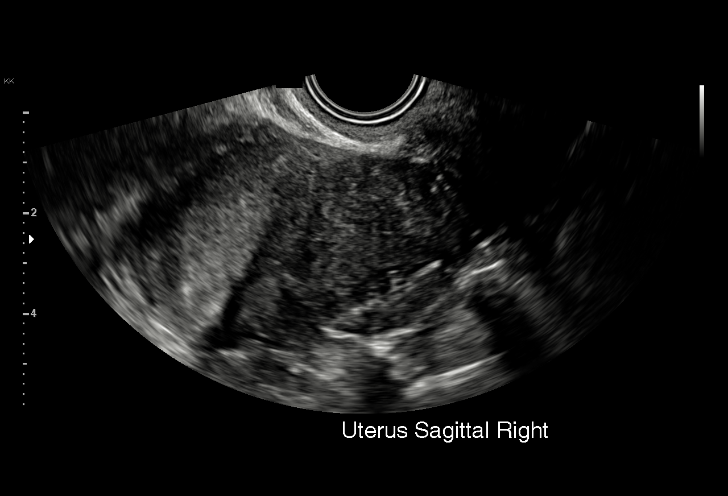
[im 9/60]
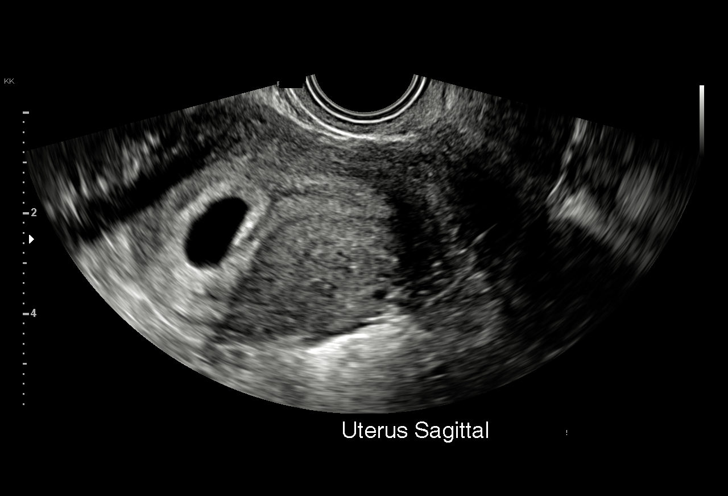
[im 14/60]
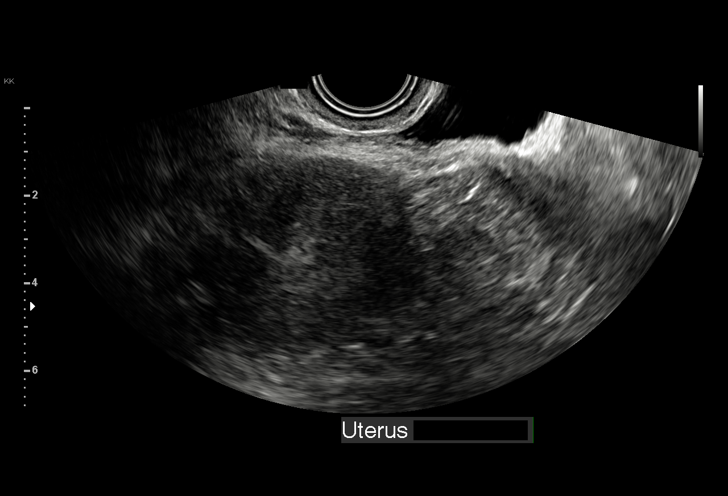
[im 18/60]
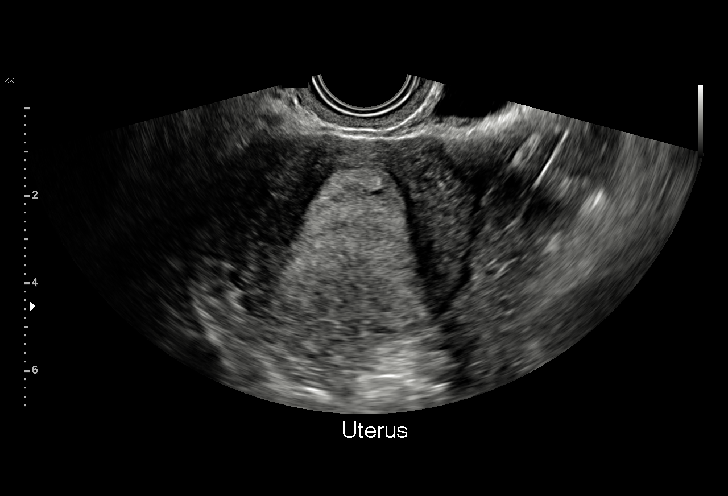
[im 22/60]
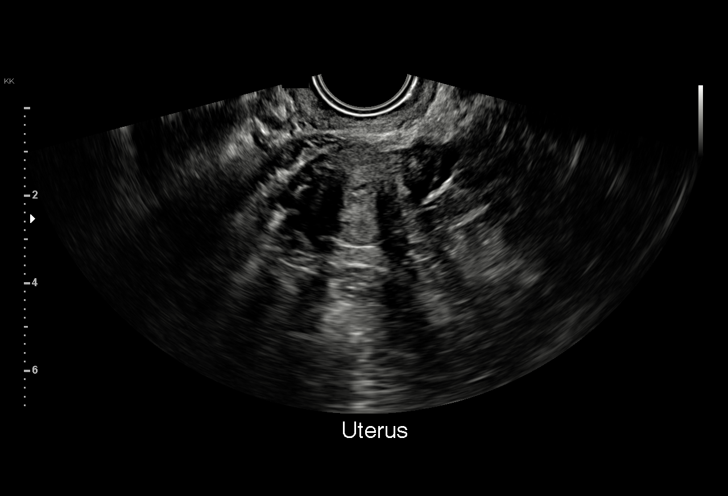
[im 27/60]
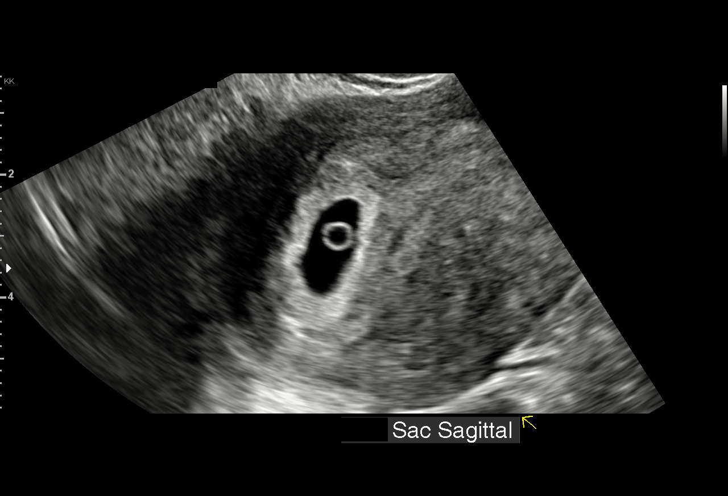
[im 31/60]
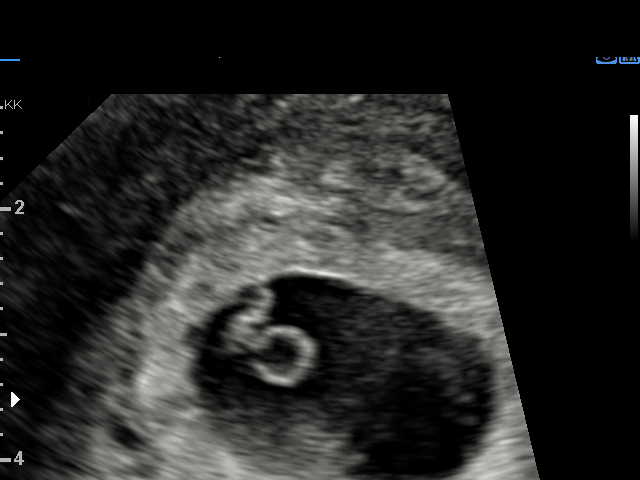
[im 33/60]
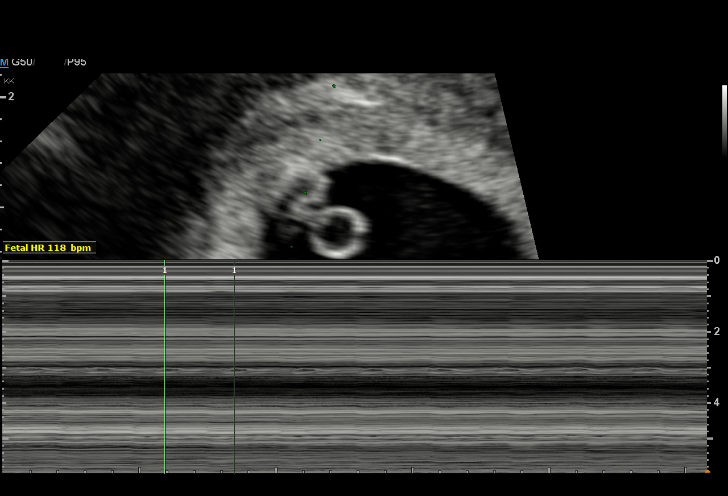
[im 38/60]
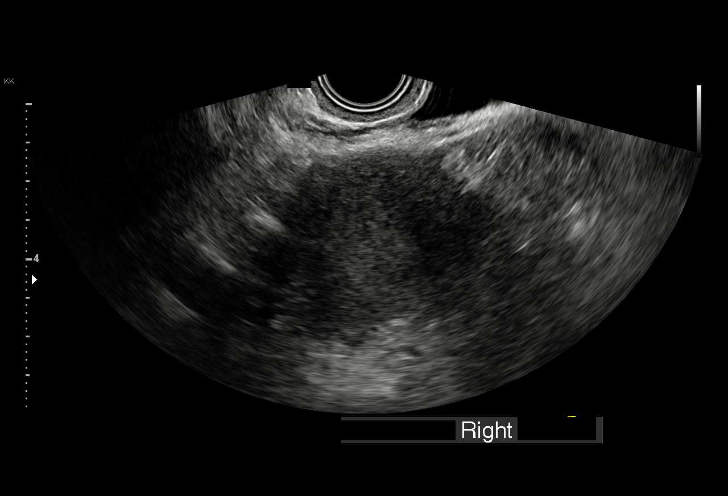
[im 42/60]
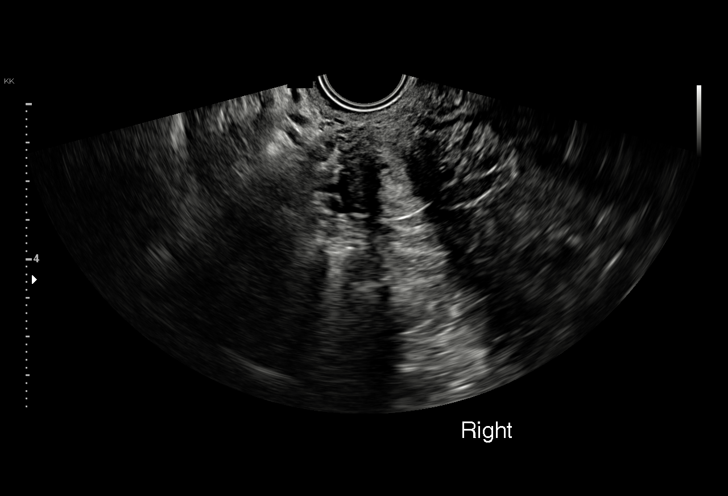
[im 46/60]
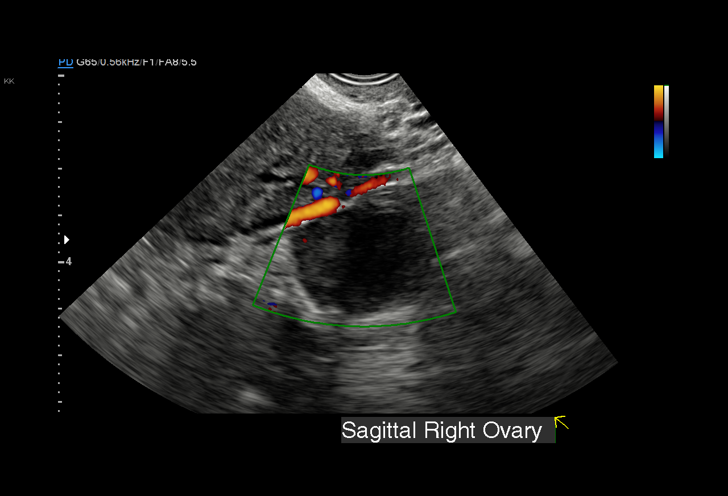
[im 51/60]
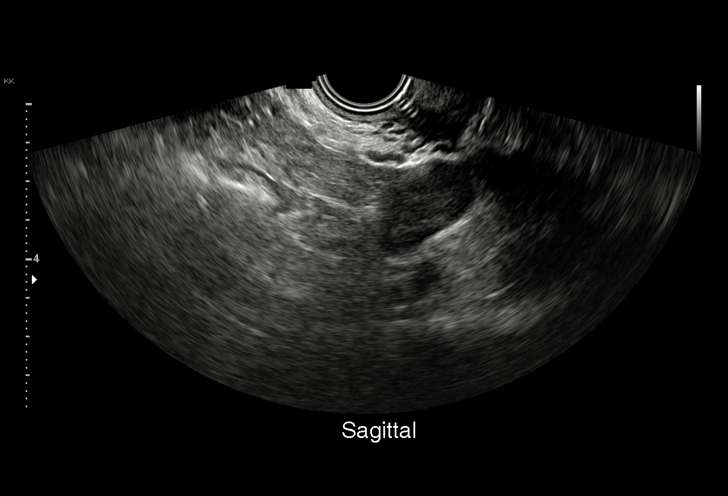
[im 55/60]
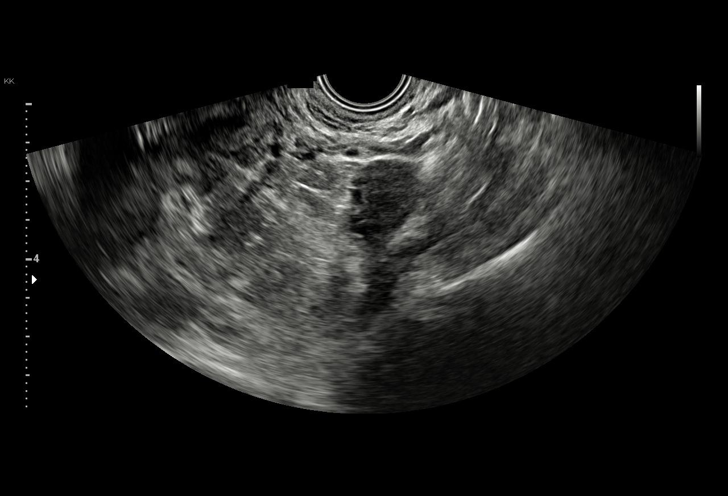
[im 60/60]
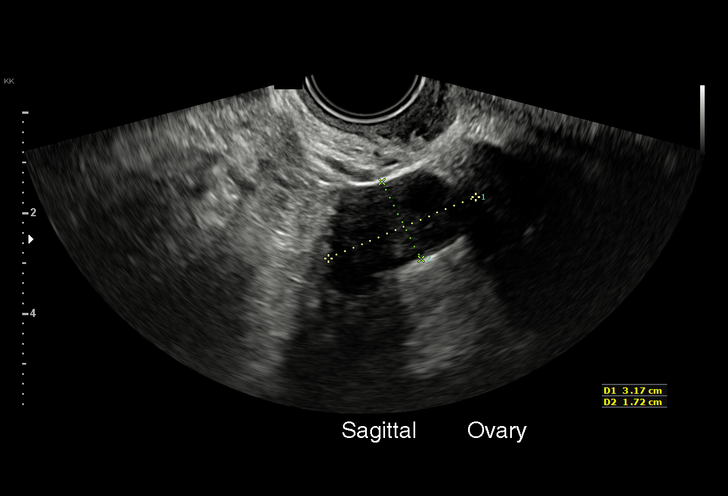

[15 of 28 positions shown; findings below may reference images not displayed]

FINDINGS: Intrauterine gestational sac: Single visualized.

Yolk sac:  Visualized.

Embryo:  Visualized.

Cardiac Activity: Visualized.

Heart Rate: 119 bpm

CRL:   6 mm   6 w 2 d                  US EDC: August 29, 2018.

Subchorionic hemorrhage:  None visualized.

Maternal uterus/adnexae: Both ovaries are unremarkable. Trace free
fluid is noted which most likely is physiologic.
IMPRESSION: Single live intrauterine gestation of 6 weeks 2 days.

## 2018-06-28 MED ORDER — METFORMIN HCL 500 MG PO TABS: 500.0000 mg | ORAL_TABLET | Freq: Every day | ORAL | 11 refills | Status: AC

## 2018-06-28 NOTE — Patient Instructions (Signed)

## 2018-06-28 NOTE — Progress Notes (Signed)
   PRENATAL VISIT NOTE  Subjective:  Laura Estrada is a 22 y.o. G2P0010 at 7529w1d being seen today for ongoing prenatal care.  She is currently monitored for the following issues for this high-risk pregnancy and has Chlamydia infection affecting pregnancy; Supervision of low-risk first pregnancy; Rh negative state in antepartum period, second trimester; Morbid obesity with BMI of 40.0-44.9, adult (HCC); Gestational diabetes; Elevated blood pressure affecting pregnancy in third trimester, antepartum; and Abnormal glucose tolerance test (GTT) during pregnancy, antepartum on their problem list.  Patient reports no complaints.  Contractions: Not present. Vag. Bleeding: None.  Movement: Present. Denies leaking of fluid.   The following portions of the patient's history were reviewed and updated as appropriate: allergies, current medications, past family history, past medical history, past social history, past surgical history and problem list. Problem list updated.  Objective:   Vitals:   06/28/18 1424  BP: 125/76  Pulse: 92  Weight: 123.1 kg (271 lb 6.4 oz)    Fetal Status: Fetal Heart Rate (bpm): 133   Movement: Present     General:  Alert, oriented and cooperative. Patient is in no acute distress.  Skin: Skin is warm and dry. No rash noted.   Cardiovascular: Normal heart rate noted  Respiratory: Normal respiratory effort, no problems with respiration noted  Abdomen: Soft, gravid, appropriate for gestational age.  Pain/Pressure: Present     Pelvic: Cervical exam deferred        Extremities: Normal range of motion.  Edema: Trace  Mental Status: Normal mood and affect. Normal behavior. Normal judgment and thought content.   Assessment and Plan:  Pregnancy: G2P0010 at 7829w1d  1. Encounter for supervision of low-risk first pregnancy in third trimester Normal BP. She will move to [facility] [place] in 2 weeks  2. Gestational diabetes mellitus (GDM) in third trimester, gestational diabetes method  of control unspecified FBS most above 95, up to 107 - metFORMIN (GLUCOPHAGE) 500 MG tablet; Take 1 tablet (500 mg total) by mouth daily with supper.  Dispense: 30 tablet; Refill: 11 - US MFM FETAL BPP WO NON STRESS; Future  Preterm labor symptoms and general obstetric precautions including but not limited to vaginal bleeding, contractions, leaking of fluid and fetal movement were reviewed in detail with the patient. Please refer to After Visit Summary for other counseling recommendations.  Return in about 1 week (around 07/05/2018) for NST.  Future Appointments  Date Time Provider Department Center  07/12/2018  2:00 PM [facility] US 3 [facility]  R/S US to next week  Scheryl DarterJames Maximina Pirozzi, MD

## 2018-07-05 ENCOUNTER — Encounter (HOSPITAL_COMMUNITY): Payer: Self-pay

## 2018-07-05 ENCOUNTER — Other Ambulatory Visit (HOSPITAL_COMMUNITY): Payer: Self-pay | Admitting: Obstetrics and Gynecology

## 2018-07-05 ENCOUNTER — Ambulatory Visit (HOSPITAL_COMMUNITY)
Admission: RE | Admit: 2018-07-05 | Discharge: 2018-07-05 | Disposition: A | Discharge status: Home / Self Care | Payer: Medicaid Other | Source: Ambulatory Visit | Attending: Student | Admitting: Student

## 2018-07-05 DIAGNOSIS — Z3A32 32 weeks gestation of pregnancy: Secondary | ICD-10-CM | POA: Insufficient documentation

## 2018-07-05 DIAGNOSIS — O26893 Other specified pregnancy related conditions, third trimester: Secondary | ICD-10-CM | POA: Insufficient documentation

## 2018-07-05 DIAGNOSIS — O24415 Gestational diabetes mellitus in pregnancy, controlled by oral hypoglycemic drugs: Secondary | ICD-10-CM | POA: Insufficient documentation

## 2018-07-05 DIAGNOSIS — O99213 Obesity complicating pregnancy, third trimester: Secondary | ICD-10-CM | POA: Insufficient documentation

## 2018-07-05 DIAGNOSIS — Z362 Encounter for other antenatal screening follow-up: Secondary | ICD-10-CM | POA: Insufficient documentation

## 2018-07-05 DIAGNOSIS — E669 Obesity, unspecified: Secondary | ICD-10-CM | POA: Insufficient documentation

## 2018-07-05 DIAGNOSIS — O133 Gestational [pregnancy-induced] hypertension without significant proteinuria, third trimester: Secondary | ICD-10-CM | POA: Insufficient documentation

## 2018-07-07 ENCOUNTER — Other Ambulatory Visit: Payer: Self-pay

## 2018-07-07 ENCOUNTER — Inpatient Hospital Stay (HOSPITAL_COMMUNITY)
Admission: AD | Admit: 2018-07-07 | Discharge: 2018-07-07 | Disposition: A | Discharge status: Home / Self Care | Payer: Medicaid Other | Source: Ambulatory Visit | Attending: Obstetrics & Gynecology | Admitting: Obstetrics & Gynecology

## 2018-07-07 ENCOUNTER — Encounter (HOSPITAL_COMMUNITY): Payer: Self-pay | Admitting: *Deleted

## 2018-07-07 DIAGNOSIS — B373 Candidiasis of vulva and vagina: Secondary | ICD-10-CM | POA: Insufficient documentation

## 2018-07-07 DIAGNOSIS — O212 Late vomiting of pregnancy: Secondary | ICD-10-CM | POA: Diagnosis present

## 2018-07-07 DIAGNOSIS — Z3A32 32 weeks gestation of pregnancy: Secondary | ICD-10-CM

## 2018-07-07 DIAGNOSIS — O219 Vomiting of pregnancy, unspecified: Secondary | ICD-10-CM | POA: Diagnosis not present

## 2018-07-07 DIAGNOSIS — O98813 Other maternal infectious and parasitic diseases complicating pregnancy, third trimester: Secondary | ICD-10-CM | POA: Diagnosis not present

## 2018-07-07 DIAGNOSIS — Z0371 Encounter for suspected problem with amniotic cavity and membrane ruled out: Secondary | ICD-10-CM

## 2018-07-07 DIAGNOSIS — O163 Unspecified maternal hypertension, third trimester: Secondary | ICD-10-CM

## 2018-07-07 DIAGNOSIS — B3731 Acute candidiasis of vulva and vagina: Secondary | ICD-10-CM

## 2018-07-07 LAB — WET PREP, GENITAL
Sperm: NONE SEEN
Trich, Wet Prep: NONE SEEN

## 2018-07-07 LAB — POCT FERN TEST: POCT Fern Test: NEGATIVE

## 2018-07-07 MED ORDER — ONDANSETRON 4 MG PO TBDP: 4.0000 mg | ORAL_TABLET | Freq: Three times a day (TID) | ORAL | 0 refills | Status: AC | PRN

## 2018-07-07 MED ORDER — ONDANSETRON 4 MG PO TBDP
4.0000 mg | ORAL_TABLET | Freq: Once | ORAL | Status: AC
  Administered 2018-07-07: 4 mg via ORAL
  Filled 2018-07-07: qty 1

## 2018-07-07 MED ORDER — TERCONAZOLE 0.4 % VA CREA: 1.0000 | TOPICAL_CREAM | Freq: Every day | VAGINAL | 0 refills | Status: AC

## 2018-07-07 NOTE — MAU Provider Note (Signed)
First Provider Initiated Contact with Patient 07/07/18 [phone]     S: Ms. Laura PalauBrooke Estrada is a 22 y.o. G2P0010 at 4246w3d  who presents to MAU today complaining nausea and possible leaking of fluid. She reports getting up go vomit in the restroom around 0300, as she was vomiting she had a gush of fluid come out. She is unsure where gush of fluid was urine or amniotic fluid. She denies vaginal bleeding. She denies contractions. She reports normal fetal movement.    O: BP 127/70 (BP Location: Right Arm)   Pulse 99   Temp (!) 97.4 F (36.3 C) (Oral)   Resp 18   Ht 5\' 7"  (1.702 m)   Wt 276 lb 4 oz (125.3 kg)   LMP 11/11/2017   BMI 43.27 kg/m  GENERAL: Well-developed, well-nourished female in no acute distress.  HEAD: Normocephalic, atraumatic.  CHEST: Normal effort of breathing, regular heart rate ABDOMEN: Soft, nontender, gravid PELVIC: Normal external female genitalia. Vagina is pink and rugated. Cervix with normal contour, no lesions. Moderate amount of white curdy discharge noted around cervix and adherent to vaginal walls.  Negative pooling.   Cervical exam:  Dilation: Closed Effacement (%): Thick Cervical Position: Posterior Exam by:: Lanice ShirtsV Cariah Salatino CNM   Fetal Monitoring: Baseline: 130 Variability: moderate Accelerations: present  Decelerations: none Contractions: none   Results for orders placed or performed during the hospital encounter of 07/07/18 (from the past 24 hour(s))  Wet prep, genital     Status: Abnormal   Collection Time: 07/07/18  3:58 AM  Result Value Ref Range   Yeast Wet Prep HPF POC PRESENT (A) NONE SEEN   Trich, Wet Prep NONE SEEN NONE SEEN   Clue Cells Wet Prep HPF POC PRESENT (A) NONE SEEN   WBC, Wet Prep HPF POC MODERATE (A) NONE SEEN   Sperm NONE SEEN   POCT fern test     Status: Normal   Collection Time: 07/07/18  4:30 AM  Result Value Ref Range   POCT Fern Test Negative = intact amniotic membranes    A: SIUP at 4446w3d  Membranes intact  Vaginal yeast  infection  Nausea and vomiting during pregnancy   P: Discharge home   Follow up as scheduled for prenatal appointments  Return to MAU as needed  Rx for Terazol and Zofran sent to pharmacy of choice  Pt stable prior to discharge home    Sharyon CableRogers, Torryn Fiske C, [place] 07/07/2018 4:47 AM

## 2018-07-07 NOTE — MAU Note (Signed)
PT SAYS SHE WOKE  AT 0230-  VOMITING . VOMIT  Q DAY .   THEN FELT HERSELF VOID   THEN HAD A GUSH  OF FLUID.    [facility]   LAST SEX- LAST WEEK.   FEELS MILD CRAMPS

## 2018-07-12 ENCOUNTER — Ambulatory Visit (HOSPITAL_COMMUNITY): Payer: Medicaid Other

## 2018-10-26 IMAGING — US US OB TRANSVAGINAL
1 series · 14 of 28 positions shown · non-contrast
Comparison: None.

CLINICAL DATA: Left pelvic pain for 5 days. Positive pregnancy
test.

EXAM:
OBSTETRIC <14 WK US AND TRANSVAGINAL OB US
TECHNIQUE: Both transabdominal and transvaginal ultrasound examinations were
performed for complete evaluation of the gestation as well as the
maternal uterus, adnexal regions, and pelvic cul-de-sac.
Transvaginal technique was performed to assess early pregnancy.

[Series 1: us ob transvaginal · 0.28mm/px · 14 of 82 slices shown]
[im 4/82]
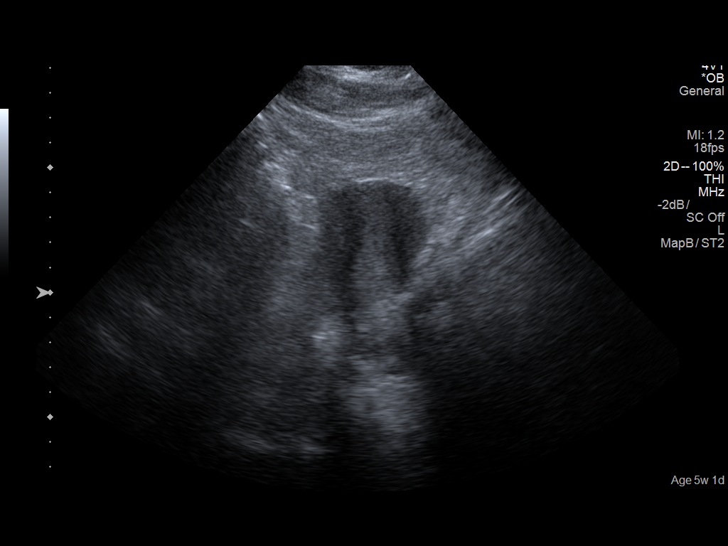
[im 10/82]
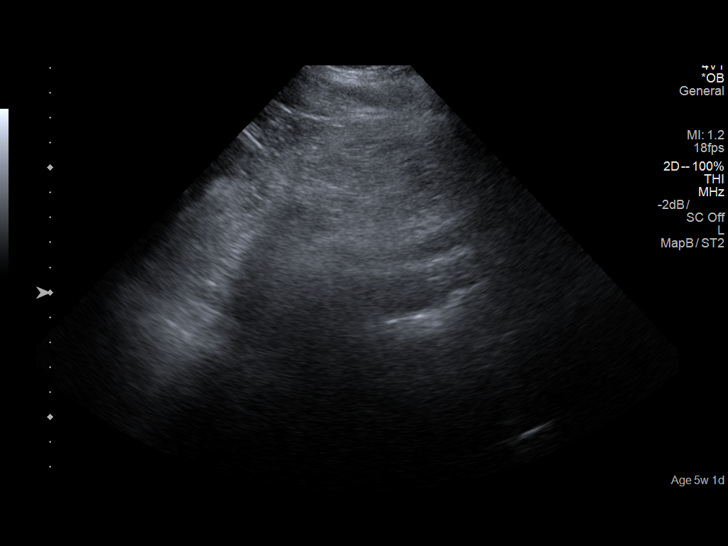
[im 16/82]
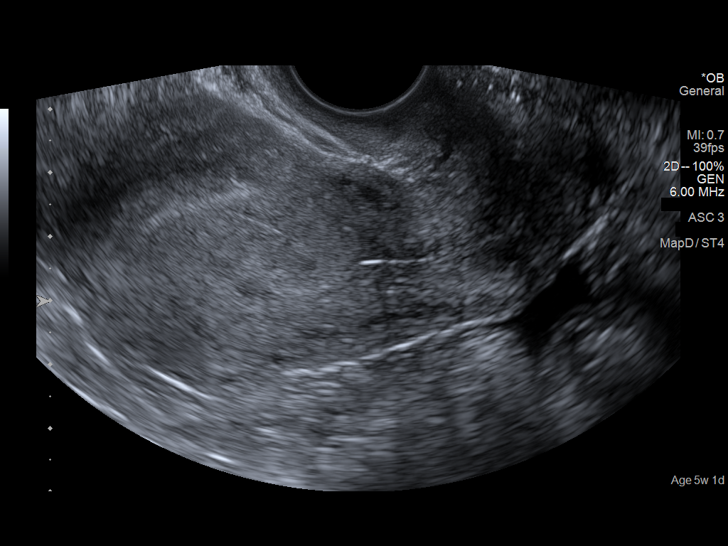
[im 22/82]
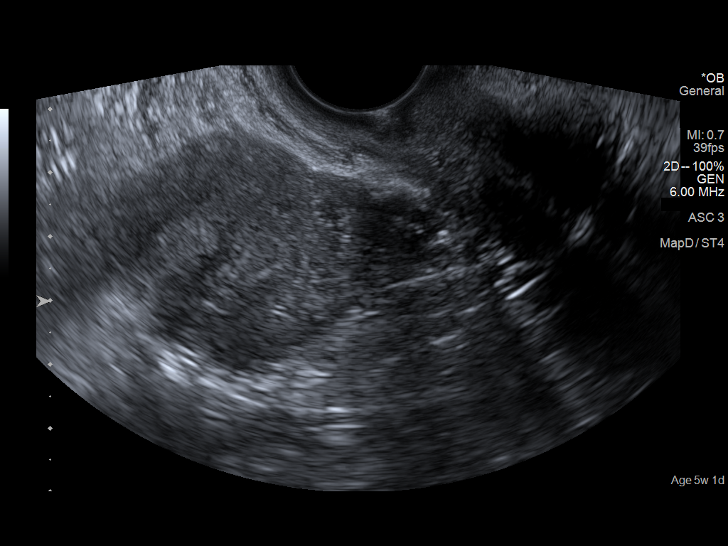
[im 28/82]
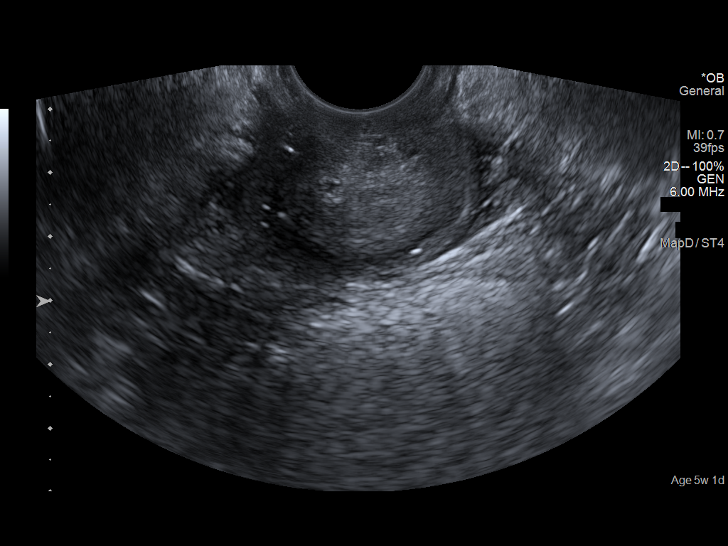
[im 34/82]
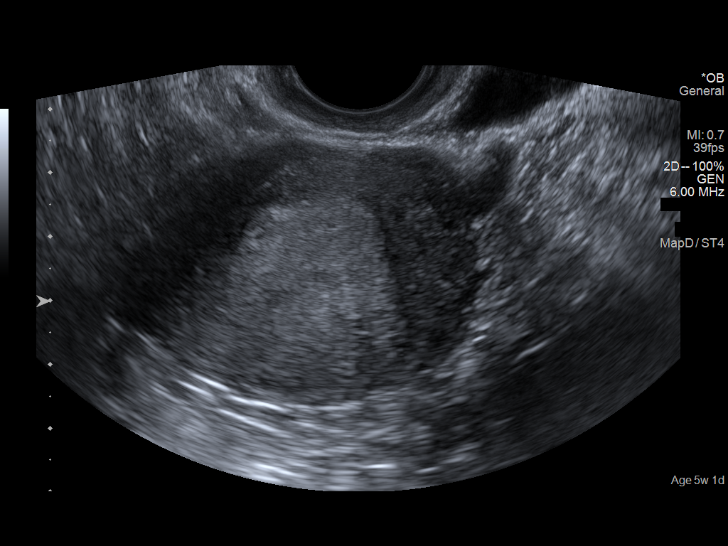
[im 40/82]
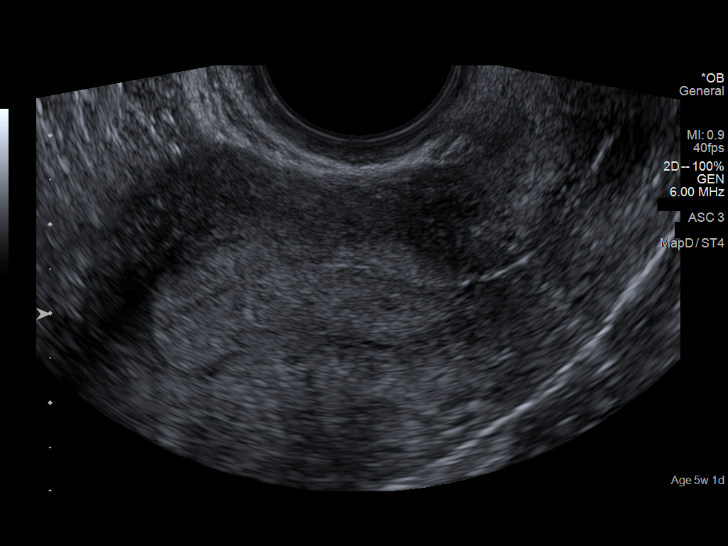
[im 46/82]
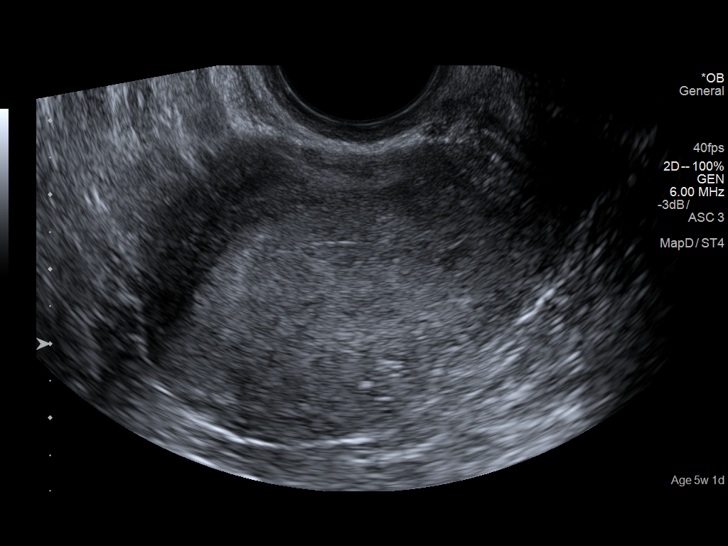
[im 52/82]
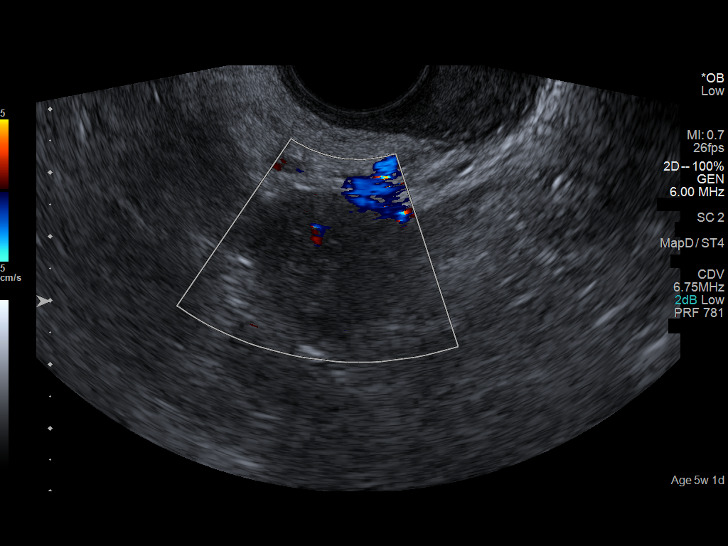
[im 58/82]
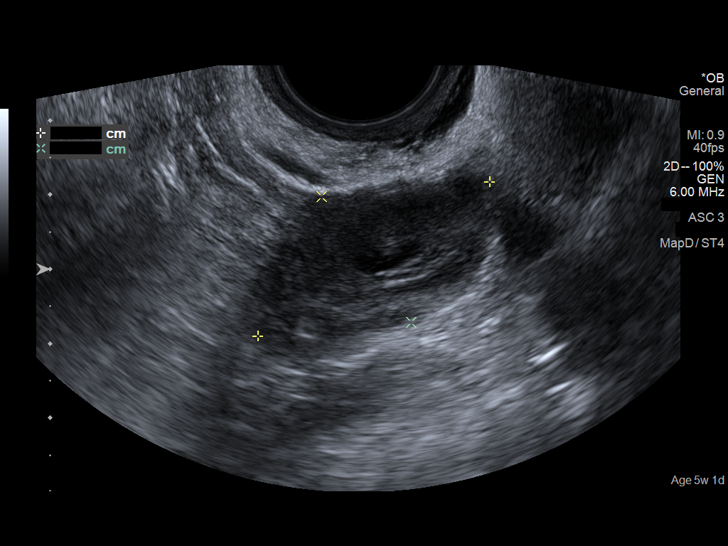
[im 64/82]
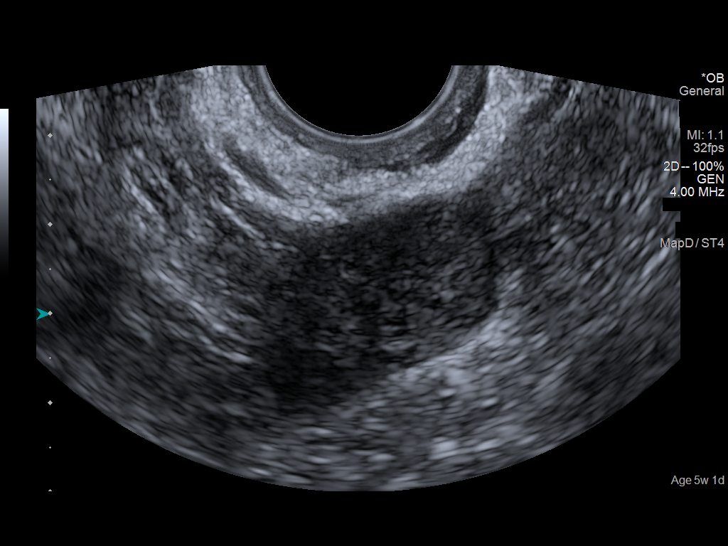
[im 70/82]
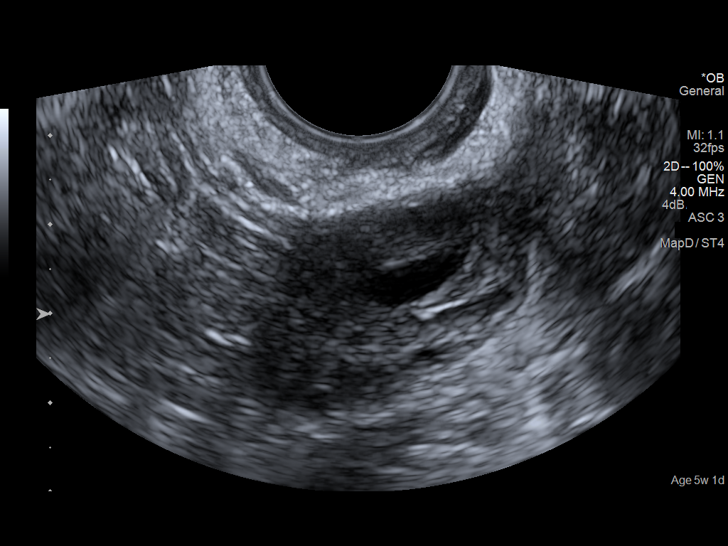
[im 76/82]
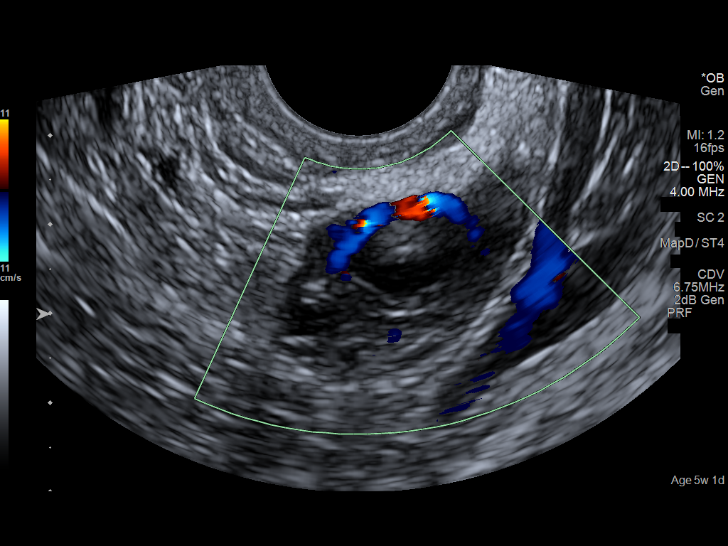
[im 82/82]
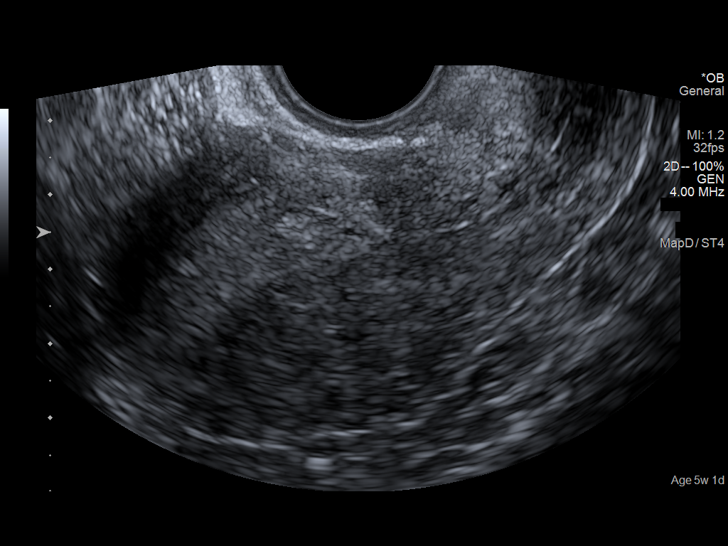

[14 of 28 positions shown; findings below may reference images not displayed]

FINDINGS: Intrauterine gestational sac: None visualized.

Subchorionic hemorrhage:  None visualized.

Maternal uterus/adnexae: 17 mm complex, thick walled cystic focus
identified in the parenchyma of the left ovary with no architecture
identified within the small central cystic component. Right ovary
unremarkable. No evidence for adnexal mass. Very trace simple
appearing free fluid in the cul-de-sac.
IMPRESSION: 1. No intrauterine gestational sac. Probable left ovarian corpus
luteum cyst with no adnexal mass identified. Given the history of a
positive pregnancy test, differential considerations include
intrauterine gestation too early to visualize, completed abortion,
or nonvisualized ectopic pregnancy.

## 2018-10-27 IMAGING — US US MFM OB FOLLOW-UP
1 series · 14 of 28 positions shown · non-contrast
Comparison: none

[Series 1: us mfm ob follow-up · 53 acquisitions, 14 frames shown]
[im 2/53]
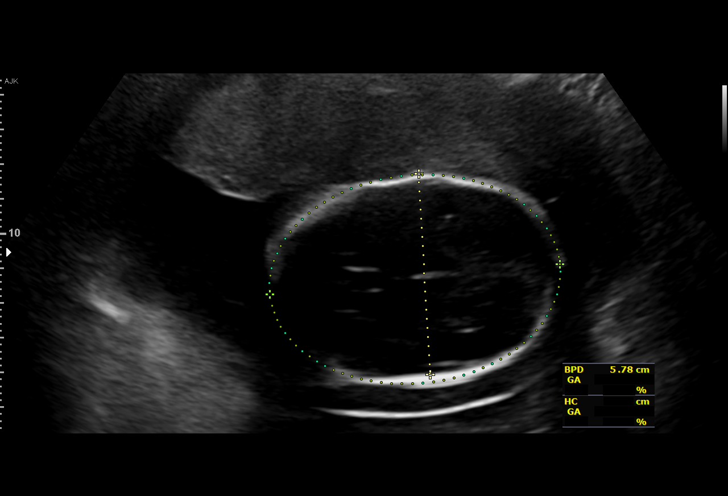
[im 6/53]
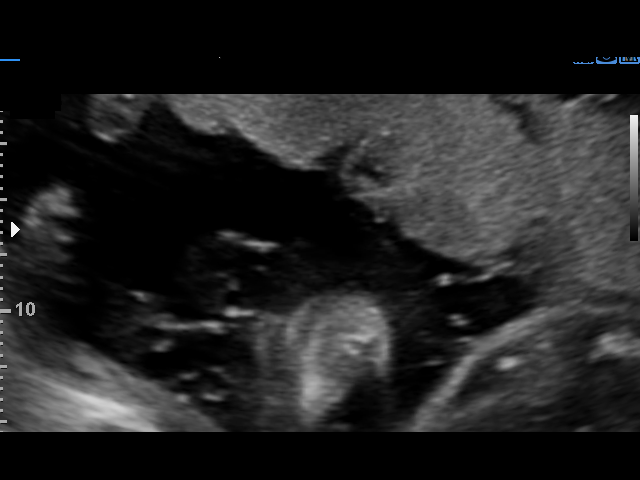
[im 10/53]
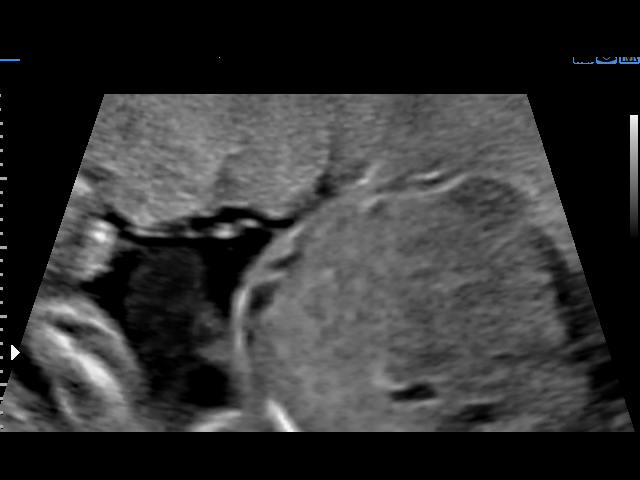
[im 14/53]
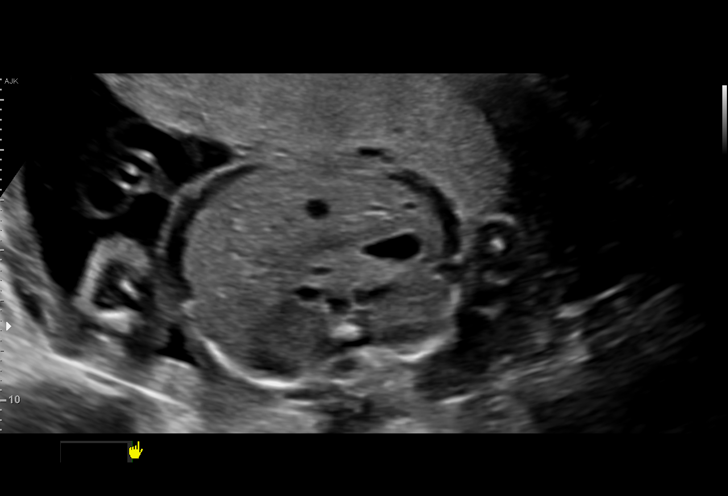
[im 18/53]
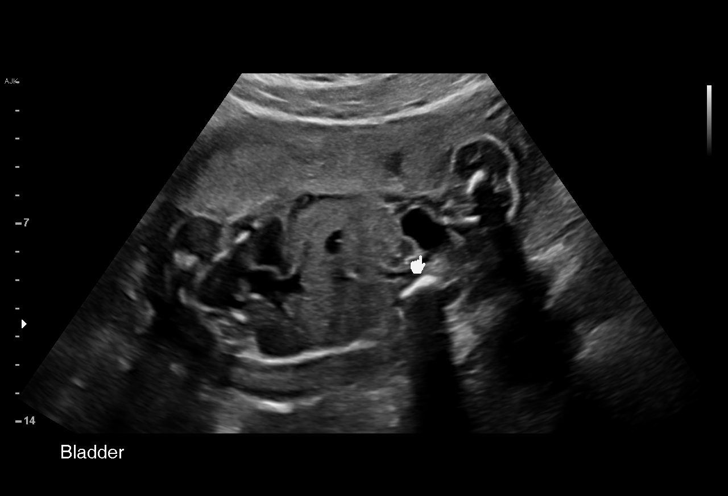
[im 22/53]
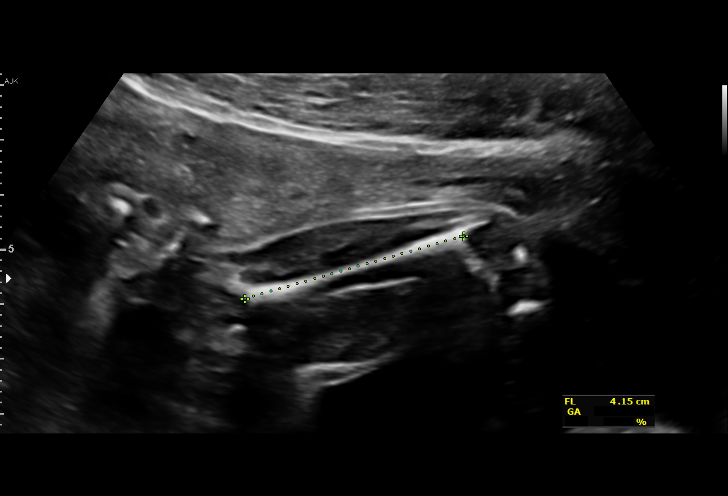
[im 26/53]
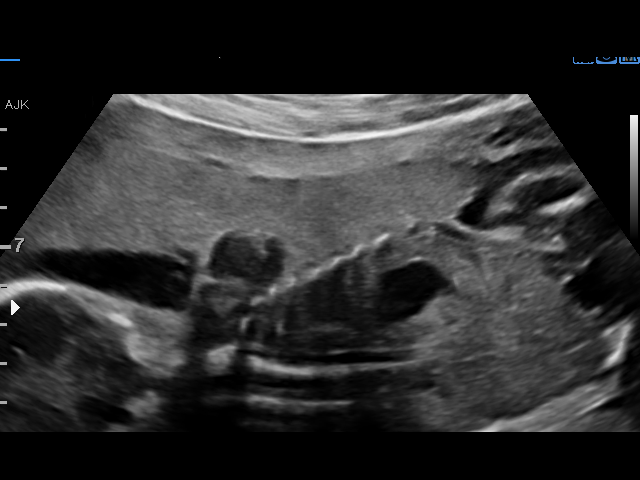
[im 29/53]
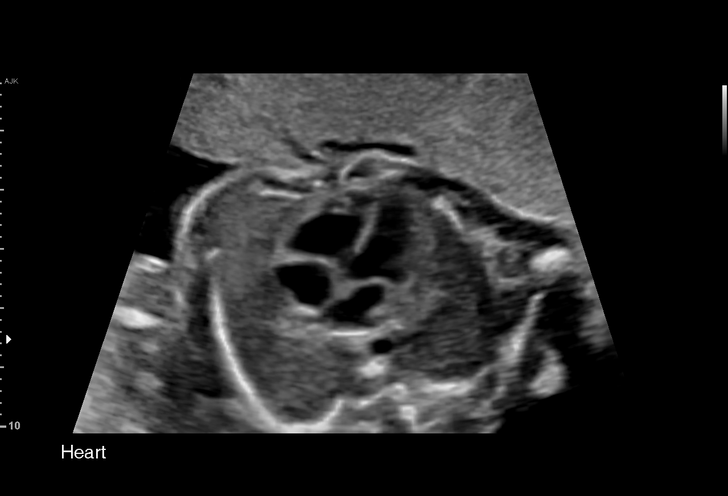
[im 33/53]
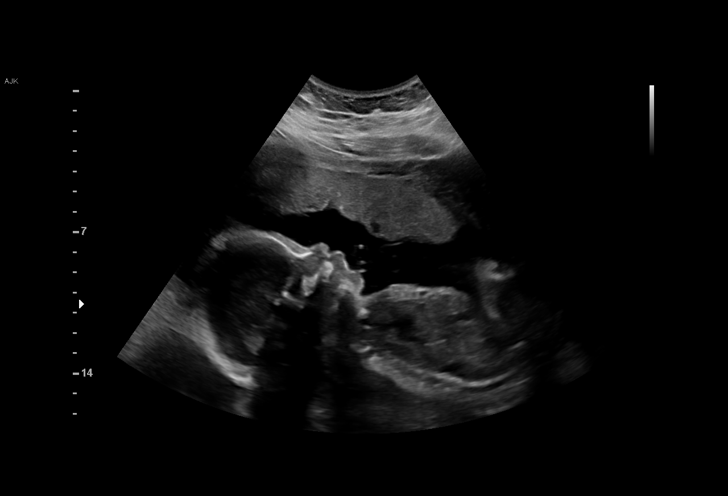
[im 37/53]
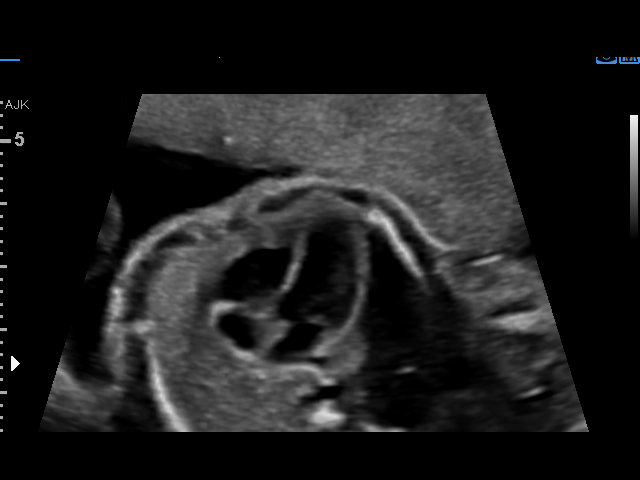
[im 41/53]
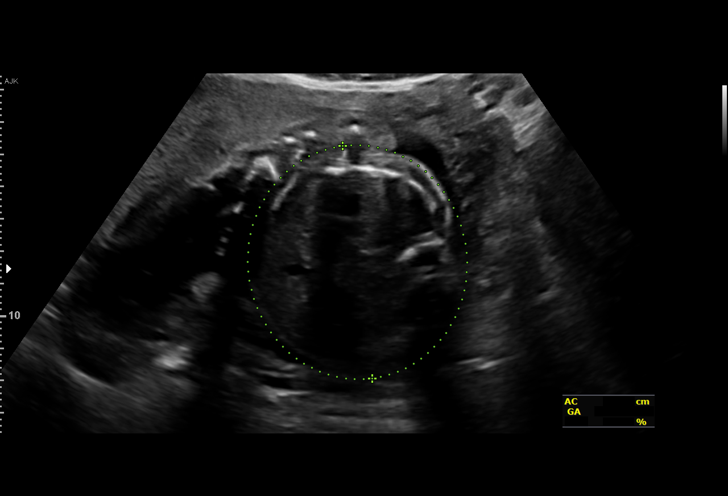
[im 45/53]
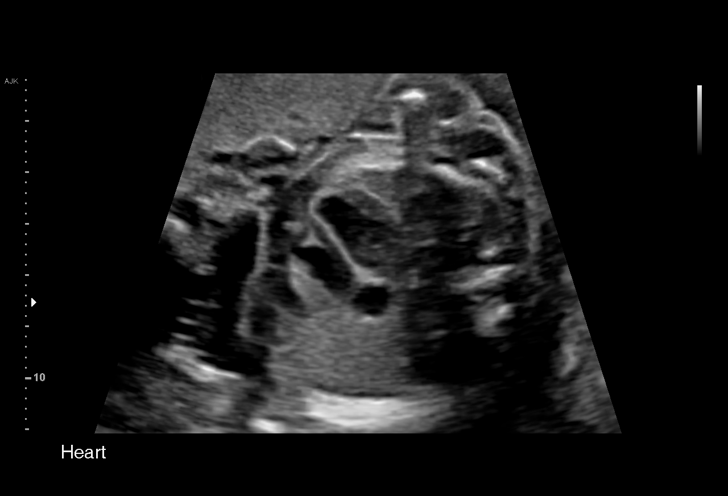
[im 49/53]
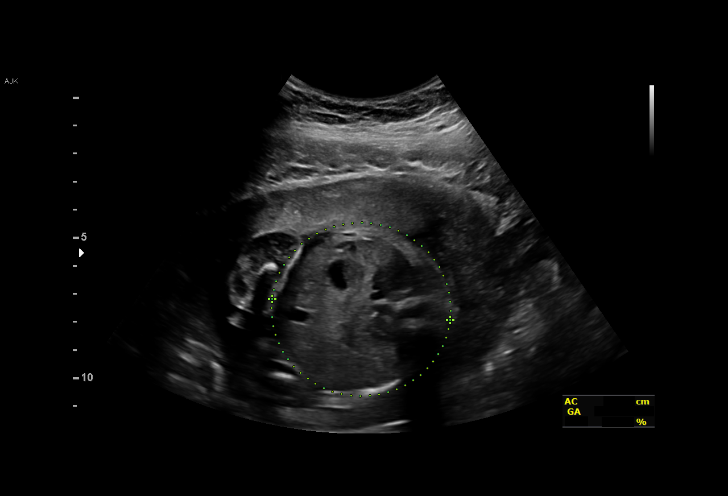
[im 53/53]
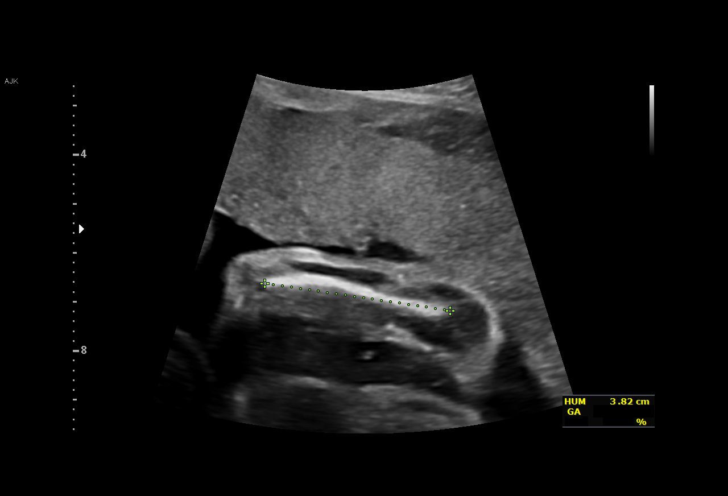

[14 of 28 positions shown; findings below may reference images not displayed]

ZVEZDELIN NP

1  FALLON JIM           [id number]      [id number]     [id number]
Indications

23 weeks gestation of pregnancy
Obesity complicating pregnancy, second
trimester
Rh negative state in antepartum
Encounter for other antenatal screening
follow-up
OB History

Blood Type:            Height:  5'7"   Weight (lb):  273       BMI:
Gravidity:    2          SAB:   1
Fetal Evaluation

Num Of Fetuses:     1
Fetal Heart         140
Rate(bpm):
Cardiac Activity:   Observed
Presentation:       Breech
Placenta:           Anterior, above cervical os

Amniotic Fluid
AFI FV:      Subjectively within normal limits

Largest Pocket(cm)
4.99
Biometry

BPD:      57.7  mm     G. Age:  23w 5d         48  %    CI:         66.6   %    70 - 86
FL/HC:      18.4   %    18.7 -
HC:      226.6  mm     G. Age:  24w 5d         76  %    HC/AC:      1.03        1.05 -
AC:       219   mm     G. Age:  26w 3d       > 97  %    FL/BPD:     72.1   %    71 - 87
FL:       41.6  mm     G. Age:  23w 4d         37  %    FL/AC:      19.0   %    20 - 24
Est. FW:     763  gm    1 lb 11 oz      76  %
Gestational Age

LMP:           25w 1d        Date:  11/11/17                 EDD:   08/18/18
U/S Today:     24w 4d                                        EDD:   08/22/18
Best:          23w 4d     Det. By:  Early Ultrasound         EDD:   08/29/18
(01/05/18)
Anatomy

Cranium:               Appears normal         Aortic Arch:            Previously seen
Cavum:                 Appears normal         Ductal Arch:            Previously seen
Ventricles:            Appears normal         Diaphragm:              Appears normal
Choroid Plexus:        Previously seen        Stomach:                Appears normal, left
sided
Cerebellum:            Previously seen        Abdomen:                Appears normal
Posterior Fossa:       Previously seen        Abdominal Wall:         Previously seen
Nuchal Fold:           Previously seen        Cord Vessels:           Previously seen
Face:                  Profile appears        Kidneys:                Previously seen
normal
Lips:                  Appears normal         Bladder:                Appears normal
Thoracic:              Appears normal         Spine:                  Previously seen
Heart:                 Appears normal         Upper Extremities:      Previously seen
(4CH, axis, and situs
RVOT:                  Appears normal         Lower Extremities:      Previously seen
LVOT:                  Appears normal

Other:  Fetus appears to be a male. Heels and 5th digit previously seen.
Technically difficult due to maternal habitus and fetal position.
Cervix Uterus Adnexa

Cervix
Length:           4.27  cm.
Normal appearance by transabdominal scan.

Uterus
No abnormality visualized.

Left Ovary
Not visualized.

Right Ovary
Not visualized.

Adnexa:       No abnormality visualized.
Impression

Singleton intrauterine pregnancy at 23+4 weeks with obesity
here for completion of the anatomic survey
Interval review of the anatomy shows no sonographic
markers for aneuploidy or structural anomalies
All relevant fetal anatomy has been visualized
Amniotic fluid volume is normal
Estimated fetal weight shows growth in the 76th percentile
Recommendations

Abdominal circumfrence is >97th percentile; recommend
repeat evaluation of growth in 6 weeks

## 2018-11-21 ENCOUNTER — Encounter (HOSPITAL_COMMUNITY): Payer: Self-pay
